# Patient Record
Sex: Male | Born: 1955
Health system: Southern US, Community
[De-identification: ages and names within clinical notes are randomized; demographics above are authoritative.]

## PROBLEM LIST (undated history)

## (undated) DIAGNOSIS — J189 Pneumonia, unspecified organism: Secondary | ICD-10-CM

## (undated) DIAGNOSIS — I10 Essential (primary) hypertension: Secondary | ICD-10-CM

## (undated) DIAGNOSIS — J349 Unspecified disorder of nose and nasal sinuses: Secondary | ICD-10-CM

## (undated) DIAGNOSIS — M1990 Unspecified osteoarthritis, unspecified site: Secondary | ICD-10-CM

## (undated) DIAGNOSIS — I2510 Atherosclerotic heart disease of native coronary artery without angina pectoris: Secondary | ICD-10-CM

## (undated) DIAGNOSIS — I219 Acute myocardial infarction, unspecified: Secondary | ICD-10-CM

## (undated) DIAGNOSIS — J449 Chronic obstructive pulmonary disease, unspecified: Secondary | ICD-10-CM

## (undated) DIAGNOSIS — R519 Headache, unspecified: Secondary | ICD-10-CM

## (undated) DIAGNOSIS — E785 Hyperlipidemia, unspecified: Secondary | ICD-10-CM

## (undated) DIAGNOSIS — D649 Anemia, unspecified: Secondary | ICD-10-CM

## (undated) DIAGNOSIS — G4730 Sleep apnea, unspecified: Secondary | ICD-10-CM

## (undated) DIAGNOSIS — K219 Gastro-esophageal reflux disease without esophagitis: Secondary | ICD-10-CM

## (undated) HISTORY — PX: CARDIAC CATHETERIZATION: SHX172

## (undated) HISTORY — PX: TONSILLECTOMY: SUR1361

## (undated) HISTORY — PX: KNEE SURGERY: SHX244

## (undated) HISTORY — PX: CORONARY ANGIOPLASTY: SHX604

## (undated) HISTORY — DX: Chronic obstructive pulmonary disease, unspecified: J44.9

## (undated) HISTORY — DX: Sleep apnea, unspecified: G47.30

## (undated) HISTORY — DX: Unspecified disorder of nose and nasal sinuses: J34.9

## (undated) HISTORY — DX: Hyperlipidemia, unspecified: E78.5

## (undated) HISTORY — DX: Essential (primary) hypertension: I10

## (undated) HISTORY — PX: NASAL SINUS SURGERY: SHX719

## (undated) MED FILL — Dexamethasone Sodium Phosphate Inj 100 MG/10ML: INTRAMUSCULAR | Qty: 1 | Status: AC

---

## 2007-10-20 ENCOUNTER — Encounter: Admission: RE | Admit: 2007-10-20 | Discharge: 2007-10-20 | Payer: Self-pay | Admitting: Occupational Medicine

## 2010-08-19 ENCOUNTER — Encounter: Payer: Self-pay | Admitting: Occupational Medicine

## 2014-01-18 ENCOUNTER — Encounter: Payer: Self-pay | Admitting: Critical Care Medicine

## 2014-01-18 ENCOUNTER — Ambulatory Visit (INDEPENDENT_AMBULATORY_CARE_PROVIDER_SITE_OTHER): Payer: Self-pay | Admitting: Critical Care Medicine

## 2014-01-18 VITALS — BP 134/80 | HR 84 | Temp 99.1°F | Ht 72.0 in | Wt 357.0 lb

## 2014-01-18 DIAGNOSIS — J441 Chronic obstructive pulmonary disease with (acute) exacerbation: Secondary | ICD-10-CM

## 2014-01-18 DIAGNOSIS — F17200 Nicotine dependence, unspecified, uncomplicated: Secondary | ICD-10-CM | POA: Insufficient documentation

## 2014-01-18 DIAGNOSIS — G4730 Sleep apnea, unspecified: Secondary | ICD-10-CM

## 2014-01-18 DIAGNOSIS — J449 Chronic obstructive pulmonary disease, unspecified: Secondary | ICD-10-CM | POA: Insufficient documentation

## 2014-01-18 DIAGNOSIS — F17210 Nicotine dependence, cigarettes, uncomplicated: Secondary | ICD-10-CM | POA: Insufficient documentation

## 2014-01-18 MED ORDER — PREDNISONE 10 MG PO TABS: ORAL_TABLET | ORAL | Status: DC

## 2014-01-18 MED ORDER — ALBUTEROL SULFATE (2.5 MG/3ML) 0.083% IN NEBU: 2.5000 mg | INHALATION_SOLUTION | Freq: Four times a day (QID) | RESPIRATORY_TRACT | Status: AC | PRN

## 2014-01-18 MED ORDER — FLUTICASONE FUROATE-VILANTEROL 100-25 MCG/INH IN AEPB: 1.0000 | INHALATION_SPRAY | Freq: Every day | RESPIRATORY_TRACT | Status: DC

## 2014-01-18 MED ORDER — AZITHROMYCIN 250 MG PO TABS: ORAL_TABLET | ORAL | Status: DC

## 2014-01-18 MED ORDER — TIOTROPIUM BROMIDE MONOHYDRATE 2.5 MCG/ACT IN AERS: INHALATION_SPRAY | RESPIRATORY_TRACT | Status: DC

## 2014-01-18 NOTE — Patient Instructions (Addendum)
Overnight sleep oximetry on cpap will be ordered Spirometry and ABGs ordered at [facility] [place] Prednisone 10mg   Take 4 for three days 3 for three days 2 for three days 1 for three days and stop Start azithromycin 250mg  Take two once then one daily until gone New cpap supplies ordered Start Breo one puff daily Start Spiriva two puff daily Stop Duoneb Stop Advair Stop atrovent Start Albuterol in nebulizer as needed Focus on smoking cessation, use nicorette Minis 4mg  6-8 per day Return 1 month

## 2014-01-18 NOTE — Progress Notes (Signed)
Subjective:    Patient ID: Antonio Benson, male    DOB: 1955/09/26, 58 y.o.   MRN: [id number]  HPI Comments: Dx Copd >19yrs.  Smokes 1PPD.  Tried: patches, meds, hypnosis, etc.    Shortness of Breath This is a chronic problem. The current episode started more than 1 year ago. The problem occurs constantly (constantly dyspneic, intermittent cough). The problem has been gradually worsening. Associated symptoms include orthopnea, PND and sputum production. Pertinent negatives include no chest pain, hemoptysis, leg pain or leg swelling. Associated symptoms comments: Felt poorly for two months  Pt has cpap and uses every night Mucus clear to brown.. Risk factors include smoking. He has tried steroid inhalers and beta agonist inhalers for the symptoms. The treatment provided moderate relief. His past medical history is significant for COPD. There is no history of allergies, asthma, bronchiolitis, CAD, DVT, a heart failure, PE or pneumonia. (Only allergy mold)    Past Medical History  Diagnosis Date  . COPD (chronic obstructive pulmonary disease)   . Sinus trouble   . Sleep apnea      No family history on file.   History   Social History  . Marital Status: Married    Spouse Name: N/A    Number of Children: N/A  . 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 Smoking status: Current Every Day Smoker -- 1.00 packs/day for 40 years    Types: Cigarettes  . Smokeless tobacco: Never Used  . Alcohol Use: No  . Drug Use: Not on file  . Sexual Activity: Not on file   Other Topics Concern  . Not on file   Social History Narrative  . No narrative on file     No Known Allergies   No outpatient prescriptions prior to visit.   No facility-administered medications prior to visit.      Review of Systems  Constitutional: Positive for unexpected weight change.  HENT: Positive for trouble swallowing.   Respiratory: Positive for  sputum production and shortness of breath. Negative for hemoptysis.   Cardiovascular: Positive for orthopnea and PND. Negative for chest pain and leg swelling.  Gastrointestinal: Negative.        Objective:   Physical Exam Filed Vitals:   01/18/14 1608  BP: 134/80  Pulse: 84  Temp: 99.1 F (37.3 C)  TempSrc: Oral  Height: 6' (1.829 m)  Weight: 357 lb (161.934 kg)  SpO2: 92%    Gen: Pleasant, well-nourished, in no distress,  normal affect  ENT: No lesions,  mouth clear,  oropharynx clear, no postnasal drip  Neck: No JVD, no TMG, no carotid bruits  Lungs: No use of accessory muscles, no dullness to percussion, exp wheezes, poor airflow  Cardiovascular: RRR, heart sounds normal, no murmur or gallops, no peripheral edema  Abdomen: soft and NT, no HSM,  BS normal  Musculoskeletal: No deformities, no cyanosis or clubbing  Neuro: alert, non focal  Skin: Warm, no lesions or rashes  No results found.  CXR: hyperinflation. NAD    Assessment & Plan:   COPD (chronic obstructive pulmonary disease) Chronic obstructive lung disease with asthmatic bronchitic component and associated morbid obesity and ongoing tobacco use Borderline desaturation with ambulation Plan Overnight sleep oximetry on cpap will be ordered Spirometry and ABGs ordered at [facility] Prednisone 10mg   Take 4 for three days 3 for three days 2 for three days 1 for three days and stop  Start azithromycin 250mg  Take two once then one daily until gone New cpap supplies ordered Start Breo one puff daily Start Spiriva two puff daily Stop Duoneb Stop Advair Stop atrovent Start Albuterol in nebulizer as needed Focus on smoking cessation, use nicorette Minis 4mg  6-8 per day Return 1 month    Tobacco use disorder Ongoing tobacco use Greater than 10 minutes smoking cessation counseling issued to the patient The patient will use Nicorette minis for smoking cessation  Sleep apnea Severe sleep apnea The  patient needs new CPAP supplies   Updated Medication List Outpatient Encounter Prescriptions as of 01/18/2014  Medication Sig  . albuterol (PROVENTIL HFA;VENTOLIN HFA) 108 (90 BASE) MCG/ACT inhaler Inhale 2 puffs into the lungs every 6 (six) hours as needed for wheezing or shortness of breath.  Marland Kitchen aspirin 81 MG tablet Take 81 mg by mouth daily.  . [DISCONTINUED] Fluticasone-Salmeterol (ADVAIR) 250-50 MCG/DOSE AEPB Inhale 1 puff into the lungs 2 (two) times daily.  . [DISCONTINUED] ipratropium (ATROVENT HFA) 17 MCG/ACT inhaler Inhale 2 puffs into the lungs 3 (three) times daily as needed for wheezing.  . [DISCONTINUED] ipratropium-albuterol (DUONEB) 0.5-2.5 (3) MG/3ML SOLN Take 3 mLs by nebulization every 6 (six) hours as needed.  Marland Kitchen albuterol (PROVENTIL) (2.5 MG/3ML) 0.083% nebulizer solution Take 3 mLs (2.5 mg total) by nebulization every 6 (six) hours as needed for wheezing or shortness of breath.  Marland Kitchen azithromycin (ZITHROMAX) 250 MG tablet Take two once then one daily until gone  . Fluticasone Furoate-Vilanterol (BREO ELLIPTA) 100-25 MCG/INH AEPB Inhale 1 puff into the lungs daily.  . predniSONE (DELTASONE) 10 MG tablet Take 4 for three days 3 for three days 2 for three days 1 for three days and stop  . Tiotropium Bromide Monohydrate (SPIRIVA RESPIMAT) 2.5 MCG/ACT AERS Two puff daily

## 2014-01-18 NOTE — Assessment & Plan Note (Signed)
Severe sleep apnea The patient needs new CPAP supplies

## 2014-01-18 NOTE — Assessment & Plan Note (Signed)
Chronic obstructive lung disease with asthmatic bronchitic component and associated morbid obesity and ongoing tobacco use Borderline desaturation with ambulation Plan Overnight sleep oximetry on cpap will be ordered Spirometry and ABGs ordered at [facility] Prednisone 10mg   Take 4 for three days 3 for three days 2 for three days 1 for three days and stop Start azithromycin 250mg  Take two once then one daily until gone New cpap supplies ordered Start Breo one puff daily Start Spiriva two puff daily Stop Duoneb Stop Advair Stop atrovent Start Albuterol in nebulizer as needed Focus on smoking cessation, use nicorette Minis 4mg  6-8 per day Return 1 month

## 2014-01-18 NOTE — Assessment & Plan Note (Signed)
Ongoing tobacco use Greater than 10 minutes smoking cessation counseling issued to the patient The patient will use Nicorette minis for smoking cessation

## 2014-01-24 ENCOUNTER — Telehealth: Payer: Self-pay | Admitting: Critical Care Medicine

## 2014-01-24 NOTE — Telephone Encounter (Signed)
ATC Amanda-no answer. WCB in the AM

## 2014-01-25 ENCOUNTER — Telehealth: Payer: Self-pay | Admitting: Critical Care Medicine

## 2014-01-25 ENCOUNTER — Encounter: Payer: Self-pay | Admitting: *Deleted

## 2014-01-25 LAB — PULMONARY FUNCTION TEST

## 2014-01-25 NOTE — Telephone Encounter (Signed)
Pt wife came into [place] office.   Pt was seen in [place] for consult on 01/18/14 by Dr. Joya Gaskins. States pt was out of work from 12/27/13 - 01/10/14 under note given by Dr. Lin Landsman. Requesting a note to keep pt out of work from June 15 - July 3 d/t medical conditions/breathing.  Reports pt's breathing is "a little better."  He is scheduled for PFTs and ABG today at 4 pm.  Per wife, pt has cut back "tremendously" on smoking. I spoke with Dr. Joya Gaskins.  He is ok with giving note for pt to be out of work from June 15 - January 28, 2014.   Note created and given to wife.  She verbalized understanding and voiced no further questions or concerns at this time.

## 2014-01-25 NOTE — Telephone Encounter (Signed)
Dr. Joya Gaskins ordered spiro pre/post and ABG to be done at [facility] to see what kind of referral is needed to fax.  Order's are in epic for PFT and spiro w/ graph. Called spoke with Rhonda-[facility]'s to see if anything specifically needs to be sent. She will take care of this and fax referral over. Nothing further needed

## 2014-02-08 ENCOUNTER — Telehealth: Payer: Self-pay | Admitting: Critical Care Medicine

## 2014-02-08 NOTE — Telephone Encounter (Signed)
Order never received by [facility] in [place]. Faxed order for ONO on CPAP and referral for [facility] in [place] to provide new cpap mask and to call me with cpap settings [phone]). Called and spoke with Ginger and she is aware that both orders has been faxed to [facility] and if she doesn't hear from them by Friday 02/11/14 to call me back, personally. Rhonda J Cobb

## 2014-02-08 NOTE — Telephone Encounter (Signed)
I called [facility] in [place] and was told they do not have any orders on pt since May of 2014. Please advise pcc's thanks

## 2014-02-08 NOTE — Telephone Encounter (Signed)
Pt orders are in epic and placed 01/18/14. I can't tell if anything has been done with them.

## 2014-02-14 ENCOUNTER — Telehealth: Payer: Self-pay | Admitting: Critical Care Medicine

## 2014-02-14 DIAGNOSIS — J418 Mixed simple and mucopurulent chronic bronchitis: Secondary | ICD-10-CM

## 2014-02-14 NOTE — Telephone Encounter (Signed)
Tell pt ABGs show no need to change beyond cpap, no indication for bipap.  CO2 not elevated  PFTs show severe obstruction. No change in therapy.needs to focus on smoking cessation

## 2014-02-14 NOTE — Telephone Encounter (Signed)
Also tell pt ono POS for desaturation. He will need OXYGEN 2L QHS only from [place] [facility]. To use With CPAP.  Needs repeat ONO on oxygen 2L with cpap.

## 2014-02-15 ENCOUNTER — Ambulatory Visit: Payer: Self-pay | Admitting: Critical Care Medicine

## 2014-02-16 NOTE — Telephone Encounter (Signed)
Order faxed to [facility] in [place]

## 2014-02-16 NOTE — Telephone Encounter (Signed)
Called, spoke with pt's wife per pt's request. Informed her of below results and recs per Dr. Joya Gaskins. She verbalized understanding of all results and recs and will inform pt. She is aware [facility] will be contacting them to set up o2 and to have ONO repeated on o2 with cpap. She is to call back if she has not heard from [place] by the end of the wk.  PCCs, orders have been placed for o2 start and ONO.  Please ensure they are sent to [place].  Thank you.

## 2014-02-25 ENCOUNTER — Encounter: Payer: Self-pay | Admitting: Critical Care Medicine

## 2014-03-01 ENCOUNTER — Encounter: Payer: Self-pay | Admitting: Critical Care Medicine

## 2014-03-01 ENCOUNTER — Ambulatory Visit (INDEPENDENT_AMBULATORY_CARE_PROVIDER_SITE_OTHER): Payer: Self-pay | Admitting: Critical Care Medicine

## 2014-03-01 VITALS — BP 132/78 | HR 69 | Temp 98.3°F | Ht 72.0 in | Wt 362.8 lb

## 2014-03-01 DIAGNOSIS — J418 Mixed simple and mucopurulent chronic bronchitis: Secondary | ICD-10-CM

## 2014-03-01 DIAGNOSIS — G4730 Sleep apnea, unspecified: Secondary | ICD-10-CM

## 2014-03-01 DIAGNOSIS — F17200 Nicotine dependence, unspecified, uncomplicated: Secondary | ICD-10-CM

## 2014-03-01 DIAGNOSIS — J411 Mucopurulent chronic bronchitis: Secondary | ICD-10-CM

## 2014-03-01 MED ORDER — METHYLPREDNISOLONE ACETATE 80 MG/ML IJ SUSP
120.0000 mg | Freq: Once | INTRAMUSCULAR | Status: AC
  Administered 2014-03-01: 120 mg via INTRAMUSCULAR

## 2014-03-01 NOTE — Assessment & Plan Note (Signed)
Ongoing tobacco use The patient was given smoking cessation counseling

## 2014-03-01 NOTE — Assessment & Plan Note (Signed)
The patient has an improperly fitting CPAP mask and will be given a new mask for improved seal and then we'll repeat overnight sleep oximetry on CPAP

## 2014-03-01 NOTE — Assessment & Plan Note (Signed)
Asthmatic bronchitis with recurrent hypoxemia at night Portion of nocturnal desaturation likely due to to improperly fitting mask Plan And Depo-Medrol injection was administered The patient will be given in a properly fitting mask Maintain inhaled medications

## 2014-03-01 NOTE — Patient Instructions (Signed)
Focus on smoking cessation, use the nicorette replacement lozenges A depomedrol 120mg  injection was given Stay on inhalers We will look into getting the oxygen and the new cpap mask obtained Return 2 months

## 2014-03-01 NOTE — Progress Notes (Signed)
Subjective:    Patient ID: Antonio Benson, male    DOB: 12/31/1955, 58 y.o.   MRN: [id number]  HPI 03/01/2014 Chief Complaint  Patient presents with  . Follow-up    sob slightly better, staying indoors due to humidlity,occass. cough-clear,occass. wheezing, denies cp or tightness, no fcs,trying ro wear CPAP does not have O2 yet and mask  Borderline desaturation with ambulation  Not yet got oxygen or new mask Pt on 1/2 PPD. Now on e cigs. The patient has not yet received a CPAP mask. The patient never received nocturnal oxygen as prescribed. Patient has a dry cough and ongoing dyspnea. Patient still smoking one half pack a day of cigarettes.             Review of Systems Constitutional:   No  weight loss, night sweats,  Fevers, chills, fatigue, lassitude. HEENT:   No headaches,  Difficulty swallowing,  Tooth/dental problems,  Sore throat,                No sneezing, itching, ear ache, nasal congestion, post nasal drip,   CV:  No chest pain,  Orthopnea, PND, swelling in lower extremities, anasarca, dizziness, palpitations  GI  No heartburn, indigestion, abdominal pain, nausea, vomiting, diarrhea, change in bowel habits, loss of appetite  Resp: Notes  shortness of breath with exertion not  at rest.  No excess mucus, no productive cough,  Notes  non-productive cough,  No coughing up of blood.  No change in color of mucus.  No wheezing.  No chest wall deformity  Skin: no rash or lesions.  GU: no dysuria, change in color of urine, no urgency or frequency.  No flank pain.  MS:  No joint pain or swelling.  No decreased range of motion.  No back pain.  Psych:  No change in mood or affect. No depression or anxiety.  No memory loss.     Objective:   Physical Exam Filed Vitals:   03/01/14 1141  BP: 132/78  Pulse: 69  Temp: 98.3 F (36.8 C)  TempSrc: Oral  Height: 6' (1.829 m)  Weight: 362 lb 12.8 oz (164.565 kg)  SpO2: 93%    Gen: Morbidly obese, in no distress,  normal  affect  ENT: No lesions,  mouth clear,  oropharynx clear, no postnasal drip  Neck: No JVD, no TMG, no carotid bruits  Lungs: No use of accessory muscles, no dullness to percussion, a few expired wheezes  Cardiovascular: RRR, heart sounds normal, no murmur or gallops, no peripheral edema  Abdomen: soft and NT, no HSM,  BS normal  Musculoskeletal: No deformities, no cyanosis or clubbing  Neuro: alert, non focal  Skin: Warm, no lesions or rashes  No results found.        Assessment & Plan:   COPD (chronic obstructive pulmonary disease) Asthmatic bronchitis with recurrent hypoxemia at night Portion of nocturnal desaturation likely due to to improperly fitting mask Plan And Depo-Medrol injection was administered The patient will be given in a properly fitting mask Maintain inhaled medications  Tobacco use disorder Ongoing tobacco use The patient was given smoking cessation counseling  Sleep apnea The patient has an improperly fitting CPAP mask and will be given a new mask for improved seal and then we'll repeat overnight sleep oximetry on CPAP   Updated Medication List Outpatient Encounter Prescriptions as of 03/01/2014  Medication Sig  . albuterol (PROVENTIL HFA;VENTOLIN HFA) 108 (90 BASE) MCG/ACT inhaler Inhale 2 puffs into the lungs every 6 (  six) hours as needed for wheezing or shortness of breath.  Marland Kitchen albuterol (PROVENTIL) (2.5 MG/3ML) 0.083% nebulizer solution Take 3 mLs (2.5 mg total) by nebulization every 6 (six) hours as needed for wheezing or shortness of breath.  Marland Kitchen aspirin 81 MG tablet Take 81 mg by mouth daily.  . Fluticasone Furoate-Vilanterol (BREO ELLIPTA) 100-25 MCG/INH AEPB Inhale 1 puff into the lungs daily.  . Tiotropium Bromide Monohydrate (SPIRIVA RESPIMAT) 2.5 MCG/ACT AERS Two puff daily  . [DISCONTINUED] azithromycin (ZITHROMAX) 250 MG tablet Take two once then one daily until gone  . [DISCONTINUED] predniSONE (DELTASONE) 10 MG tablet Take 4 for three  days 3 for three days 2 for three days 1 for three days and stop  . [EXPIRED] methylPREDNISolone acetate (DEPO-MEDROL) injection 120 mg

## 2014-04-05 ENCOUNTER — Telehealth: Payer: Self-pay | Admitting: Critical Care Medicine

## 2014-04-05 DIAGNOSIS — G4730 Sleep apnea, unspecified: Secondary | ICD-10-CM

## 2014-04-05 NOTE — Telephone Encounter (Signed)
Antonio Benson with [facility] needs order for cpap supplies for pt.  Dr. Joya Gaskins ok with this. Order placed and given to [facility].  Nothing further needed.

## 2015-01-17 DIAGNOSIS — J449 Chronic obstructive pulmonary disease, unspecified: Secondary | ICD-10-CM | POA: Insufficient documentation

## 2015-01-17 DIAGNOSIS — G4730 Sleep apnea, unspecified: Secondary | ICD-10-CM | POA: Insufficient documentation

## 2015-01-17 DIAGNOSIS — I429 Cardiomyopathy, unspecified: Secondary | ICD-10-CM | POA: Insufficient documentation

## 2016-07-25 DIAGNOSIS — J209 Acute bronchitis, unspecified: Secondary | ICD-10-CM | POA: Diagnosis not present

## 2016-07-25 DIAGNOSIS — Z1389 Encounter for screening for other disorder: Secondary | ICD-10-CM | POA: Diagnosis not present

## 2016-07-30 DIAGNOSIS — G4733 Obstructive sleep apnea (adult) (pediatric): Secondary | ICD-10-CM | POA: Diagnosis not present

## 2016-07-30 DIAGNOSIS — Z6841 Body Mass Index (BMI) 40.0 and over, adult: Secondary | ICD-10-CM | POA: Diagnosis not present

## 2016-07-30 DIAGNOSIS — F17200 Nicotine dependence, unspecified, uncomplicated: Secondary | ICD-10-CM | POA: Diagnosis not present

## 2016-07-30 DIAGNOSIS — I2510 Atherosclerotic heart disease of native coronary artery without angina pectoris: Secondary | ICD-10-CM | POA: Diagnosis not present

## 2016-07-30 DIAGNOSIS — I420 Dilated cardiomyopathy: Secondary | ICD-10-CM | POA: Diagnosis not present

## 2016-08-28 DIAGNOSIS — G4730 Sleep apnea, unspecified: Secondary | ICD-10-CM | POA: Diagnosis not present

## 2016-08-28 DIAGNOSIS — J449 Chronic obstructive pulmonary disease, unspecified: Secondary | ICD-10-CM | POA: Diagnosis not present

## 2016-08-28 DIAGNOSIS — R634 Abnormal weight loss: Secondary | ICD-10-CM | POA: Diagnosis not present

## 2017-01-30 ENCOUNTER — Encounter: Payer: Self-pay | Admitting: Cardiology

## 2017-01-30 ENCOUNTER — Ambulatory Visit (INDEPENDENT_AMBULATORY_CARE_PROVIDER_SITE_OTHER): Payer: Medicare Other | Admitting: Cardiology

## 2017-01-30 VITALS — BP 130/66 | HR 56 | Resp 12 | Ht 72.0 in | Wt 282.8 lb

## 2017-01-30 DIAGNOSIS — E785 Hyperlipidemia, unspecified: Secondary | ICD-10-CM | POA: Insufficient documentation

## 2017-01-30 DIAGNOSIS — G4733 Obstructive sleep apnea (adult) (pediatric): Secondary | ICD-10-CM

## 2017-01-30 DIAGNOSIS — J418 Mixed simple and mucopurulent chronic bronchitis: Secondary | ICD-10-CM | POA: Diagnosis not present

## 2017-01-30 DIAGNOSIS — I2510 Atherosclerotic heart disease of native coronary artery without angina pectoris: Secondary | ICD-10-CM | POA: Insufficient documentation

## 2017-01-30 DIAGNOSIS — I10 Essential (primary) hypertension: Secondary | ICD-10-CM | POA: Insufficient documentation

## 2017-01-30 NOTE — Patient Instructions (Signed)
Medication Instructions:  Your physician recommends that you continue on your current medications as directed. Please refer to the Current Medication list given to you today.   Labwork: Your physician recommends that you return for lab work in: today   Testing/Procedures: None   Follow-Up: Your physician recommends that you schedule a follow-up appointment in: 6 months   Any Other Special Instructions Will Be Listed Below (If Applicable).     If you need a refill on your cardiac medications before your next appointment, please call your pharmacy.

## 2017-01-30 NOTE — Progress Notes (Signed)
Cardiology Office Note:    Date:  01/30/2017   ID:  Antonio Benson, DOB March 29, 1956, MRN [id number]  PCP:  [name], MD  Cardiologist:  [name], MD    Referring MD: No ref. provider found   Chief Complaint  Patient presents with  . Follow-up  Coronary artery disease  History of Present Illness:    Antonio Benson is a 61 y.o. male  with coronary artery disease. He is doing very well, he is asymptomatic except for shortness of breath. He exercises in the regular basis and lost 85 pounds. I congratulated him for this. Incarcerated to keep exercising. He saidis the fact that he keeps smoking. We talked in length is 10 minutes about how he can quit potential ways to quit this habit.  Past Medical History:  Diagnosis Date  . COPD (chronic obstructive pulmonary disease) ([place])   . Hyperlipidemia   . Hypertension   . Sinus trouble   . Sleep apnea     Past Surgical History:  Procedure Laterality Date  . KNEE SURGERY    . NASAL SINUS SURGERY  1990's   deviated septum     Current Medications: Current Meds  Medication Sig  . albuterol (PROVENTIL HFA;VENTOLIN HFA) 108 (90 BASE) MCG/ACT inhaler Inhale 2 puffs into the lungs every 6 (six) hours as needed for wheezing or shortness of breath.  Marland Kitchen albuterol (PROVENTIL) (2.5 MG/3ML) 0.083% nebulizer solution Take 3 mLs (2.5 mg total) by nebulization every 6 (six) hours as needed for wheezing or shortness of breath.  Marland Kitchen aspirin 81 MG tablet Take 81 mg by mouth daily.  Marland Kitchen atorvastatin (LIPITOR) 80 MG tablet Take 1 tablet by mouth daily.  . nitroGLYCERIN (NITROSTAT) 0.4 MG SL tablet Take 1 tablet by mouth as needed for chest pain.  Marland Kitchen umeclidinium bromide (INCRUSE ELLIPTA) 62.5 MCG/INH AEPB Inhale 1 puff into the lungs daily.     Allergies:   Patient has no known allergies.   Social History   Social History  . Marital status: Married    Spouse name: N/A  . Number of children: N/A  . 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 Smoking status: Current Every Day Smoker    Packs/day: 1.00    Years: 40.00    Types: Cigarettes  . Smokeless tobacco: Never Used  . Alcohol use No  . Drug use: No  . Sexual activity: Not Asked   Other Topics Concern  . None   Social History Narrative  . None     Family History: The patient's family history includes Aneurysm in his father; Asthma in his mother. ROS:   Please see the history of present illness.     All other systems reviewed and are negative.  EKGs/Labs/Other Studies Reviewed:      Recent Labs: No results found for requested labs within last 8760 hours.  Recent Lipid Panel No results found for: CHOL, TRIG, HDL, CHOLHDL, VLDL, LDLCALC, LDLDIRECT  Physical Exam:    VS:  BP 130/66   Pulse (!) 56   Resp 12   Ht 6' (1.829 m)   Wt 282 lb 12.8 oz (128.3 kg)   BMI 38.35 kg/m     Wt Readings from Last 3 Encounters:  01/30/17 282 lb 12.8 oz (128.3 kg)  03/01/14 (!) 362 lb 12.8 oz (164.6 kg)  01/18/14 (!) 357 lb (161.9 kg)     GEN:  Well nourished, well developed in no acute distress HEENT: Normal NECK: No JVD; No carotid bruits LYMPHATICS: No lymphadenopathy CARDIAC: RRR, no murmurs, no rubs, no gallops RESPIRATORY:  Poor entry bilaterally with bilateral rhonchi. There was a few wheezes. ABDOMEN: Soft, non-tender, non-distended MUSCULOSKELETAL:  No edema; No deformity  SKIN: Warm and dry LOWER EXTREMITIES: no swelling NEUROLOGIC:  Alert and oriented x 3 PSYCHIATRIC:  Normal affect   ASSESSMENT:    1. Mixed simple and mucopurulent chronic bronchitis ([place])   2. Obstructive sleep apnea syndrome   3. Coronary artery disease involving native coronary artery of native heart without angina pectoris   4. Dyslipidemia   5. Essential hypertension    PLAN:    In order of problems listed above:  1. Coronary artery disease: Asymptomatic, doing well. We'll continue present  management. 2. Morbid obesity: He is doing great job losing weight so for 85 pounds still dropping. He cut down carbohydrates, he is exercising on a regular basis.  3. COPD: Centrally he still continued to smoke and again we spent 10 minutes talking about ways to quit again distended and he said he will try. 4. Dyslipidemia: We'll check his fasting profile today. 5. Essential hypertension. Blood pressure is well-controlled continue present management.   Medication Adjustments/Labs and Tests Ordered: Current medicines are reviewed at length with the patient today.  Concerns regarding medicines are outlined above.  No orders of the defined types were placed in this encounter.  Medication changes: No orders of the defined types were placed in this encounter.   Signed, [name], MD  01/30/2017 1:53 PM    [facility]

## 2017-01-31 LAB — LIPID PANEL
CHOLESTEROL TOTAL: 91 mg/dL — AB (ref 100–199)
Chol/HDL Ratio: 2.8 ratio (ref 0.0–5.0)
HDL: 32 mg/dL — AB (ref >39)
LDL CALC: 38 mg/dL (ref 0–99)
Triglycerides: 104 mg/dL (ref 0–149)
VLDL CHOLESTEROL CAL: 21 mg/dL (ref 5–40)

## 2017-02-19 DIAGNOSIS — G4730 Sleep apnea, unspecified: Secondary | ICD-10-CM | POA: Diagnosis not present

## 2017-02-19 DIAGNOSIS — Z87891 Personal history of nicotine dependence: Secondary | ICD-10-CM | POA: Diagnosis not present

## 2017-02-19 DIAGNOSIS — F17200 Nicotine dependence, unspecified, uncomplicated: Secondary | ICD-10-CM | POA: Diagnosis not present

## 2017-02-19 DIAGNOSIS — J449 Chronic obstructive pulmonary disease, unspecified: Secondary | ICD-10-CM | POA: Diagnosis not present

## 2017-02-19 DIAGNOSIS — R0602 Shortness of breath: Secondary | ICD-10-CM | POA: Diagnosis not present

## 2017-02-27 DIAGNOSIS — Z87891 Personal history of nicotine dependence: Secondary | ICD-10-CM | POA: Diagnosis not present

## 2017-03-06 DIAGNOSIS — Z7901 Long term (current) use of anticoagulants: Secondary | ICD-10-CM | POA: Diagnosis not present

## 2017-03-06 DIAGNOSIS — I509 Heart failure, unspecified: Secondary | ICD-10-CM | POA: Diagnosis not present

## 2017-03-06 DIAGNOSIS — G4730 Sleep apnea, unspecified: Secondary | ICD-10-CM | POA: Diagnosis not present

## 2017-03-06 DIAGNOSIS — J449 Chronic obstructive pulmonary disease, unspecified: Secondary | ICD-10-CM | POA: Diagnosis not present

## 2017-04-24 DIAGNOSIS — J441 Chronic obstructive pulmonary disease with (acute) exacerbation: Secondary | ICD-10-CM | POA: Diagnosis not present

## 2017-04-24 DIAGNOSIS — Z6839 Body mass index (BMI) 39.0-39.9, adult: Secondary | ICD-10-CM | POA: Diagnosis not present

## 2017-04-24 DIAGNOSIS — Z23 Encounter for immunization: Secondary | ICD-10-CM | POA: Diagnosis not present

## 2017-05-06 DIAGNOSIS — Z6839 Body mass index (BMI) 39.0-39.9, adult: Secondary | ICD-10-CM | POA: Diagnosis not present

## 2017-05-06 DIAGNOSIS — R5381 Other malaise: Secondary | ICD-10-CM | POA: Diagnosis not present

## 2017-05-06 DIAGNOSIS — R5383 Other fatigue: Secondary | ICD-10-CM | POA: Diagnosis not present

## 2017-05-06 DIAGNOSIS — E782 Mixed hyperlipidemia: Secondary | ICD-10-CM | POA: Diagnosis not present

## 2017-05-06 DIAGNOSIS — Z79899 Other long term (current) drug therapy: Secondary | ICD-10-CM | POA: Diagnosis not present

## 2017-05-06 DIAGNOSIS — J441 Chronic obstructive pulmonary disease with (acute) exacerbation: Secondary | ICD-10-CM | POA: Diagnosis not present

## 2017-06-27 ENCOUNTER — Encounter: Payer: Self-pay | Admitting: *Deleted

## 2017-06-27 DIAGNOSIS — I10 Essential (primary) hypertension: Secondary | ICD-10-CM | POA: Insufficient documentation

## 2017-06-27 DIAGNOSIS — J349 Unspecified disorder of nose and nasal sinuses: Secondary | ICD-10-CM | POA: Insufficient documentation

## 2017-06-27 DIAGNOSIS — E785 Hyperlipidemia, unspecified: Secondary | ICD-10-CM | POA: Insufficient documentation

## 2017-07-03 ENCOUNTER — Telehealth: Payer: Self-pay | Admitting: Cardiology

## 2017-07-03 DIAGNOSIS — J449 Chronic obstructive pulmonary disease, unspecified: Secondary | ICD-10-CM | POA: Diagnosis not present

## 2017-07-03 DIAGNOSIS — I5089 Other heart failure: Secondary | ICD-10-CM | POA: Diagnosis not present

## 2017-07-03 DIAGNOSIS — E668 Other obesity: Secondary | ICD-10-CM | POA: Diagnosis not present

## 2017-07-03 DIAGNOSIS — R0602 Shortness of breath: Secondary | ICD-10-CM | POA: Diagnosis not present

## 2017-07-03 MED ORDER — ATORVASTATIN CALCIUM 80 MG PO TABS: 80.0000 mg | ORAL_TABLET | Freq: Every day | ORAL | 6 refills | Status: DC

## 2017-07-03 NOTE — Telephone Encounter (Signed)
Refills sent

## 2017-07-03 NOTE — Telephone Encounter (Signed)
Call [facility]vastatin to [facility] on [address] in [place]

## 2017-07-30 ENCOUNTER — Ambulatory Visit (INDEPENDENT_AMBULATORY_CARE_PROVIDER_SITE_OTHER): Payer: Medicare Other | Admitting: Cardiology

## 2017-07-30 ENCOUNTER — Encounter: Payer: Self-pay | Admitting: Cardiology

## 2017-07-30 VITALS — BP 130/70 | HR 58 | Ht 72.0 in | Wt 323.1 lb

## 2017-07-30 DIAGNOSIS — I10 Essential (primary) hypertension: Secondary | ICD-10-CM

## 2017-07-30 DIAGNOSIS — I420 Dilated cardiomyopathy: Secondary | ICD-10-CM | POA: Diagnosis not present

## 2017-07-30 DIAGNOSIS — I2510 Atherosclerotic heart disease of native coronary artery without angina pectoris: Secondary | ICD-10-CM | POA: Diagnosis not present

## 2017-07-30 DIAGNOSIS — F17200 Nicotine dependence, unspecified, uncomplicated: Secondary | ICD-10-CM | POA: Diagnosis not present

## 2017-07-30 DIAGNOSIS — E785 Hyperlipidemia, unspecified: Secondary | ICD-10-CM

## 2017-07-30 NOTE — Progress Notes (Signed)
Cardiology Office Note:    Date:  07/30/2017   ID:  Antonio Benson, DOB 1955-11-03, MRN [id number]  PCP:  [name], MD  Cardiologist:  [name], MD    Referring MD: [name], MD   Chief Complaint  Patient presents with  . Follow-up  Doing well  History of Present Illness:    Antonio Benson is a 62 y.o. male with coronary artery disease as well as cardiomyopathy.  He does have chronic problem with the weight and obesity.  Last time I saw him he lost significant weight was some good diet and exercise on the regular basis but couple months ago he stopped doing it and he gained his weight back.  Does have any chest pain tightness squeezing pressure burning chest.  Obviously very frustrated with his weight going up.  He said he got a lot of emotional stress in his family but things are much better right now he is ready to go back to his routine with low carbs diet as well as exercises on the regular basis which I strongly recommended for him to do.  Past Medical History:  Diagnosis Date  . COPD (chronic obstructive pulmonary disease) ([place])   . Hyperlipidemia   . Hypertension   . Sinus trouble   . Sleep apnea     Past Surgical History:  Procedure Laterality Date  . KNEE SURGERY    . NASAL SINUS SURGERY  1990's   deviated septum     Current Medications: Current Meds  Medication Sig  . ADVAIR DISKUS 250-50 MCG/DOSE AEPB Inhale 2 puffs into the lungs daily.  Marland Kitchen albuterol (PROVENTIL HFA;VENTOLIN HFA) 108 (90 BASE) MCG/ACT inhaler Inhale 2 puffs into the lungs every 6 (six) hours as needed for wheezing or shortness of breath.  Marland Kitchen albuterol (PROVENTIL) (2.5 MG/3ML) 0.083% nebulizer solution Take 3 mLs (2.5 mg total) by nebulization every 6 (six) hours as needed for wheezing or shortness of breath.  Marland Kitchen aspirin 81 MG tablet Take 81 mg by mouth daily.  Marland Kitchen atorvastatin (LIPITOR) 80 MG tablet Take 1 tablet (80 mg total) by mouth daily.  . carvedilol (COREG)  3.125 MG tablet Take 1 tablet by mouth 2 (two) times daily.  Marland Kitchen lisinopril (PRINIVIL,ZESTRIL) 2.5 MG tablet Take 1 tablet by mouth daily.  . nitroGLYCERIN (NITROSTAT) 0.4 MG SL tablet Take 1 tablet by mouth as needed for chest pain.  Marland Kitchen umeclidinium bromide (INCRUSE ELLIPTA) 62.5 MCG/INH AEPB Inhale 1 puff into the lungs daily.     Allergies:   Patient has no known allergies.   Social History   Socioeconomic History  . Marital status: Married    Spouse name: Not on file  . Number of children: Not on file  . Years of education: Not on file  . Highest education level: Not on file  Social Needs  . Financial resource strain: Not on file  . Food insecurity - worry: Not on file  . Food insecurity - inability: Not on file  . Transportation needs - medical: Not on file  . Transportation needs - non-medical: Not on file  Occupational History  . Occupation: mail carrier  Tobacco Use  . Smoking status: Current Every Day Smoker    Packs/day: 1.00    Years: 40.00    Pack years: 40.00    Types: Cigarettes  . Smokeless tobacco: Never Used  Substance and Sexual Activity  . Alcohol use: No  . Drug use: No  . Sexual  activity: Not on file  Other Topics Concern  . Not on file  Social History Narrative  . Not on file     Family History: The patient's family history includes Aneurysm in his father; Asthma in his mother. ROS:   Please see the history of present illness.    All 14 point review of systems negative except as described per history of present illness  EKGs/Labs/Other Studies Reviewed:      Recent Labs: No results found for requested labs within last 8760 hours.  Recent Lipid Panel    Component Value Date/Time   CHOL 91 (L) 01/30/2017 1411   TRIG 104 01/30/2017 1411   HDL 32 (L) 01/30/2017 1411   CHOLHDL 2.8 01/30/2017 1411   LDLCALC 38 01/30/2017 1411    Physical Exam:    VS:  BP 130/70   Pulse (!) 58   Ht 6' (1.829 m)   Wt (!) 323 lb 1.9 oz (146.6 kg)   SpO2  94%   BMI 43.82 kg/m     Wt Readings from Last 3 Encounters:  07/30/17 (!) 323 lb 1.9 oz (146.6 kg)  01/30/17 282 lb 12.8 oz (128.3 kg)  03/01/14 (!) 362 lb 12.8 oz (164.6 kg)     GEN:  Well nourished, well developed in no acute distress HEENT: Normal NECK: No JVD; No carotid bruits LYMPHATICS: No lymphadenopathy CARDIAC: RRR, no murmurs, no rubs, no gallops RESPIRATORY:  Clear to auscultation without rales, wheezing or rhonchi  ABDOMEN: Soft, non-tender, non-distended MUSCULOSKELETAL:  No edema; No deformity  SKIN: Warm and dry LOWER EXTREMITIES: no swelling NEUROLOGIC:  Alert and oriented x 3 PSYCHIATRIC:  Normal affect   ASSESSMENT:    1. Dilated cardiomyopathy ([place])   2. Coronary artery disease involving native coronary artery of native heart without angina pectoris   3. Essential hypertension   4. Dyslipidemia   5. Morbid obesity ([place])   6. Smoking    PLAN:    In order of problems listed above:  1. Dilated cardiomyopathy: We will continue present management I want him to go back to his routine and lose weight before we evaluate him for this problem. 2. Coronary artery disease: Stable asymptomatic we will continue present management. 3. Essential hypertension: We will continue present management.  Blood pressure appears to be well controlled. 4. His lipidemia on statin high intensity which I will continue. 5. Morbid obesity: He is determined to lose weight and he can do it I believe that he will be able to do it.  I see him back in 3 months to see how he does with his weight. 6. Smoking smokes about 25 cigarettes a day we spoke in length about that and I strongly recommended him to quit he understands he will try to do it.  But overall I think we need to take 1 step by that time.  I will start with weight management and I would talk more about smoking   Medication Adjustments/Labs and Tests Ordered: Current medicines are reviewed at length with the patient today.   Concerns regarding medicines are outlined above.  No orders of the defined types were placed in this encounter.  Medication changes: No orders of the defined types were placed in this encounter.   Signed, Park Liter, MD, [facility] 07/30/2017 9:21 AM    [place]

## 2017-07-30 NOTE — Patient Instructions (Signed)
Medication Instructions:  Your physician recommends that you continue on your current medications as directed. Please refer to the Current Medication list given to you today.  Labwork: None ordered  Testing/Procedures: None ordered  Follow-Up: Your physician recommends that you schedule a follow-up appointment in: 3 months follow up with Dr. Agustin Cree   Any Other Special Instructions Will Be Listed Below (If Applicable).     If you need a refill on your cardiac medications before your next appointment, please call your pharmacy.

## 2017-08-13 ENCOUNTER — Telehealth: Payer: Self-pay | Admitting: Cardiology

## 2017-08-13 ENCOUNTER — Other Ambulatory Visit: Payer: Self-pay

## 2017-08-13 DIAGNOSIS — J449 Chronic obstructive pulmonary disease, unspecified: Secondary | ICD-10-CM | POA: Diagnosis not present

## 2017-08-13 DIAGNOSIS — R0602 Shortness of breath: Secondary | ICD-10-CM | POA: Diagnosis not present

## 2017-08-13 DIAGNOSIS — Z7951 Long term (current) use of inhaled steroids: Secondary | ICD-10-CM | POA: Diagnosis not present

## 2017-08-13 MED ORDER — LISINOPRIL 2.5 MG PO TABS: 2.5000 mg | ORAL_TABLET | Freq: Every day | ORAL | 1 refills | Status: DC

## 2017-08-13 NOTE — Telephone Encounter (Signed)
Patient is out of lisinipril. [facility] [address] They have been sending the refill request to [facility]

## 2017-08-13 NOTE — Telephone Encounter (Signed)
Med refill was sent

## 2017-09-03 DIAGNOSIS — J449 Chronic obstructive pulmonary disease, unspecified: Secondary | ICD-10-CM | POA: Diagnosis not present

## 2017-09-03 DIAGNOSIS — E668 Other obesity: Secondary | ICD-10-CM | POA: Diagnosis not present

## 2017-09-03 DIAGNOSIS — I509 Heart failure, unspecified: Secondary | ICD-10-CM | POA: Diagnosis not present

## 2017-09-03 DIAGNOSIS — R0602 Shortness of breath: Secondary | ICD-10-CM | POA: Diagnosis not present

## 2017-09-03 DIAGNOSIS — G4730 Sleep apnea, unspecified: Secondary | ICD-10-CM | POA: Diagnosis not present

## 2017-09-09 ENCOUNTER — Telehealth: Payer: Self-pay | Admitting: Cardiology

## 2017-09-09 MED ORDER — CARVEDILOL 3.125 MG PO TABS: 3.1250 mg | ORAL_TABLET | Freq: Two times a day (BID) | ORAL | 11 refills | Status: DC

## 2017-09-09 NOTE — Telephone Encounter (Signed)
[facility] [address]

## 2017-09-09 NOTE — Telephone Encounter (Signed)
[facility] [address] [facility] in [place] please.

## 2017-10-13 DIAGNOSIS — F17200 Nicotine dependence, unspecified, uncomplicated: Secondary | ICD-10-CM | POA: Diagnosis not present

## 2017-10-13 DIAGNOSIS — Z87891 Personal history of nicotine dependence: Secondary | ICD-10-CM | POA: Diagnosis not present

## 2017-10-13 DIAGNOSIS — I5089 Other heart failure: Secondary | ICD-10-CM | POA: Diagnosis not present

## 2017-10-13 DIAGNOSIS — J449 Chronic obstructive pulmonary disease, unspecified: Secondary | ICD-10-CM | POA: Diagnosis not present

## 2017-10-13 DIAGNOSIS — G4739 Other sleep apnea: Secondary | ICD-10-CM | POA: Diagnosis not present

## 2017-10-13 DIAGNOSIS — R0602 Shortness of breath: Secondary | ICD-10-CM | POA: Diagnosis not present

## 2017-10-15 DIAGNOSIS — J449 Chronic obstructive pulmonary disease, unspecified: Secondary | ICD-10-CM | POA: Diagnosis not present

## 2017-10-22 DIAGNOSIS — Z87891 Personal history of nicotine dependence: Secondary | ICD-10-CM | POA: Diagnosis not present

## 2017-10-22 DIAGNOSIS — J449 Chronic obstructive pulmonary disease, unspecified: Secondary | ICD-10-CM | POA: Diagnosis not present

## 2017-10-22 DIAGNOSIS — G4710 Hypersomnia, unspecified: Secondary | ICD-10-CM | POA: Diagnosis not present

## 2017-10-22 DIAGNOSIS — I5089 Other heart failure: Secondary | ICD-10-CM | POA: Diagnosis not present

## 2017-10-22 DIAGNOSIS — F17210 Nicotine dependence, cigarettes, uncomplicated: Secondary | ICD-10-CM | POA: Diagnosis not present

## 2017-10-30 ENCOUNTER — Ambulatory Visit (INDEPENDENT_AMBULATORY_CARE_PROVIDER_SITE_OTHER): Payer: Medicare Other | Admitting: Cardiology

## 2017-10-30 ENCOUNTER — Encounter: Payer: Self-pay | Admitting: Cardiology

## 2017-10-30 VITALS — BP 126/70 | HR 68 | Ht 72.0 in | Wt 324.0 lb

## 2017-10-30 DIAGNOSIS — J418 Mixed simple and mucopurulent chronic bronchitis: Secondary | ICD-10-CM

## 2017-10-30 DIAGNOSIS — I2510 Atherosclerotic heart disease of native coronary artery without angina pectoris: Secondary | ICD-10-CM | POA: Diagnosis not present

## 2017-10-30 DIAGNOSIS — I420 Dilated cardiomyopathy: Secondary | ICD-10-CM

## 2017-10-30 DIAGNOSIS — F17200 Nicotine dependence, unspecified, uncomplicated: Secondary | ICD-10-CM | POA: Diagnosis not present

## 2017-10-30 DIAGNOSIS — E785 Hyperlipidemia, unspecified: Secondary | ICD-10-CM

## 2017-10-30 DIAGNOSIS — I10 Essential (primary) hypertension: Secondary | ICD-10-CM | POA: Diagnosis not present

## 2017-10-30 NOTE — Progress Notes (Signed)
Cardiology Office Note:    Date:  10/30/2017   ID:  Antonio Benson, DOB [birth year], MRN [id number]  PCP:  [name], MD  Cardiologist:  [name], MD    Referring MD: [name], MD   Chief Complaint  Patient presents with  . Follow-up  Doing well  History of Present Illness:    Antonio Benson is a 62 y.o. male with history of cardio myopathy: Coronary artery disease, hypertension, morbid obesity.  Seems to be doing well.  Admitted that he gave up on diet and exercises but getting committed to go back to it.  Denies have any shortness of breath chest pain tightness squeezing pressure burning chest.  Past Medical History:  Diagnosis Date  . COPD (chronic obstructive pulmonary disease) ([place])   . Hyperlipidemia   . Hypertension   . Sinus trouble   . Sleep apnea     Past Surgical History:  Procedure Laterality Date  . KNEE SURGERY    . NASAL SINUS SURGERY  1990's   deviated septum     Current Medications: Current Meds  Medication Sig  . ADVAIR DISKUS 250-50 MCG/DOSE AEPB Inhale 2 puffs into the lungs daily.  Marland Kitchen albuterol (PROVENTIL HFA;VENTOLIN HFA) 108 (90 BASE) MCG/ACT inhaler Inhale 2 puffs into the lungs every 6 (six) hours as needed for wheezing or shortness of breath.  Marland Kitchen albuterol (PROVENTIL) (2.5 MG/3ML) 0.083% nebulizer solution Take 3 mLs (2.5 mg total) by nebulization every 6 (six) hours as needed for wheezing or shortness of breath.  Marland Kitchen aspirin 81 MG tablet Take 81 mg by mouth daily.  Marland Kitchen atorvastatin (LIPITOR) 80 MG tablet Take 1 tablet (80 mg total) by mouth daily.  . carvedilol (COREG) 3.125 MG tablet Take 1 tablet (3.125 mg total) by mouth 2 (two) times daily.  Marland Kitchen lisinopril (PRINIVIL,ZESTRIL) 2.5 MG tablet Take 1 tablet (2.5 mg total) by mouth daily.  . nitroGLYCERIN (NITROSTAT) 0.4 MG SL tablet Take 1 tablet by mouth as needed for chest pain.  Marland Kitchen umeclidinium bromide (INCRUSE ELLIPTA) 62.5 MCG/INH AEPB Inhale 1 puff into the lungs  daily.     Allergies:   Patient has no known allergies.   Social History   Socioeconomic History  . Marital status: Married    Spouse name: Not on file  . Number of children: Not on file  . Years of education: Not on file  . Highest education level: Not on file  Occupational History  . Occupation: mail carrier  Social Needs  . Financial resource strain: Not on file  . Food insecurity:    Worry: Not on file    Inability: Not on file  . Transportation needs:    Medical: Not on file    Non-medical: Not on file  Tobacco Use  . Smoking status: Current Every Day Smoker    Packs/day: 1.00    Years: 40.00    Pack years: 40.00    Types: Cigarettes  . Smokeless tobacco: Never Used  Substance and Sexual Activity  . Alcohol use: No  . Drug use: No  . Sexual activity: Not on file  Lifestyle  . Physical activity:    Days per week: Not on file    Minutes per session: Not on file  . Stress: Not on file  Relationships  . Social connections:    Talks on phone: Not on file    Gets together: Not on file    Attends religious service: Not on file  Active member of club or organization: Not on file    Attends meetings of clubs or organizations: Not on file    Relationship status: Not on file  Other Topics Concern  . Not on file  Social History Narrative  . Not on file     Family History: The patient's family history includes Aneurysm in his father; Asthma in his mother. ROS:   Please see the history of present illness.    All 14 point review of systems negative except as described per history of present illness  EKGs/Labs/Other Studies Reviewed:      Recent Labs: No results found for requested labs within last 8760 hours.  Recent Lipid Panel    Component Value Date/Time   CHOL 91 (L) 01/30/2017 1411   TRIG 104 01/30/2017 1411   HDL 32 (L) 01/30/2017 1411   CHOLHDL 2.8 01/30/2017 1411   LDLCALC 38 01/30/2017 1411    Physical Exam:    VS:  BP 126/70   Pulse 68    Ht 6' (1.829 m)   Wt (!) 324 lb (147 kg)   SpO2 95%   BMI 43.94 kg/m     Wt Readings from Last 3 Encounters:  10/30/17 (!) 324 lb (147 kg)  07/30/17 (!) 323 lb 1.9 oz (146.6 kg)  01/30/17 282 lb 12.8 oz (128.3 kg)     GEN:  Well nourished, well developed in no acute distress HEENT: Normal NECK: No JVD; No carotid bruits LYMPHATICS: No lymphadenopathy CARDIAC: RRR, no murmurs, no rubs, no gallops RESPIRATORY:  Clear to auscultation without rales, wheezing or rhonchi  ABDOMEN: Soft, non-tender, non-distended MUSCULOSKELETAL:  No edema; No deformity  SKIN: Warm and dry LOWER EXTREMITIES: no swelling NEUROLOGIC:  Alert and oriented x 3 PSYCHIATRIC:  Normal affect   ASSESSMENT:    1. Dilated cardiomyopathy ([place])   2. Coronary artery disease involving native coronary artery of native heart without angina pectoris   3. Essential hypertension   4. Mixed simple and mucopurulent chronic bronchitis ([place])   5. Dyslipidemia   6. Smoking    PLAN:    In order of problems listed above:  1. Dilated cardiomyopathy: Will do echocardiogram on him after we come back to aspirin which will be in 3 months.  We talked about diet and good exercise habits he will go back to it. 2. Artery disease: Denies having a chest pain tightness squeezing pressure burning chest. 3. Essential hypertension: Blood pressure well controlled. 4. Dyslipidemia: We will ask him to have fasting lipid profile done today.  He is fasting 5. Walking again he spent some time talking about the need to quit he understands and will try to work on it   Medication Adjustments/Labs and Tests Ordered: Current medicines are reviewed at length with the patient today.  Concerns regarding medicines are outlined above.  No orders of the defined types were placed in this encounter.  Medication changes: No orders of the defined types were placed in this encounter.   Signed, Park Liter, MD, [facility] 10/30/2017 10:37 AM      [place]

## 2017-10-30 NOTE — Addendum Note (Signed)
Addended by: Austin Miles on: 10/30/2017 10:45 AM   Modules accepted: Orders

## 2017-10-30 NOTE — Patient Instructions (Signed)
Medication Instructions:  Your physician recommends that you continue on your current medications as directed. Please refer to the Current Medication list given to you today.   Labwork: Your physician recommends that you return for lab work today: lipid panel.  Testing/Procedures: Your physician has requested that you have an echocardiogram. Echocardiography is a painless test that uses sound waves to create images of your heart. It provides your doctor with information about the size and shape of your heart and how well your heart's chambers and valves are working. This procedure takes approximately one hour. There are no restrictions for this procedure. This test will be scheduled in the new [place] office in [place].   Follow-Up: Your physician wants you to follow-up in: 5 months. You will receive a reminder letter in the mail two months in advance. If you don't receive a letter, please call our office to schedule the follow-up appointment.   Any Other Special Instructions Will Be Listed Below (If Applicable).     If you need a refill on your cardiac medications before your next appointment, please call your pharmacy.

## 2017-10-31 LAB — LIPID PANEL
CHOLESTEROL TOTAL: 111 mg/dL (ref 100–199)
Chol/HDL Ratio: 4.3 ratio (ref 0.0–5.0)
HDL: 26 mg/dL — ABNORMAL LOW (ref >39)
LDL Calculated: 54 mg/dL (ref 0–99)
Triglycerides: 156 mg/dL — ABNORMAL HIGH (ref 0–149)
VLDL Cholesterol Cal: 31 mg/dL (ref 5–40)

## 2017-11-12 DIAGNOSIS — G4710 Hypersomnia, unspecified: Secondary | ICD-10-CM | POA: Diagnosis not present

## 2017-11-12 DIAGNOSIS — G4730 Sleep apnea, unspecified: Secondary | ICD-10-CM | POA: Diagnosis not present

## 2017-12-02 DIAGNOSIS — F17200 Nicotine dependence, unspecified, uncomplicated: Secondary | ICD-10-CM | POA: Diagnosis not present

## 2017-12-02 DIAGNOSIS — E669 Obesity, unspecified: Secondary | ICD-10-CM | POA: Diagnosis not present

## 2017-12-02 DIAGNOSIS — Z87891 Personal history of nicotine dependence: Secondary | ICD-10-CM | POA: Diagnosis not present

## 2017-12-02 DIAGNOSIS — J449 Chronic obstructive pulmonary disease, unspecified: Secondary | ICD-10-CM | POA: Diagnosis not present

## 2017-12-02 DIAGNOSIS — G4739 Other sleep apnea: Secondary | ICD-10-CM | POA: Diagnosis not present

## 2017-12-02 DIAGNOSIS — I5089 Other heart failure: Secondary | ICD-10-CM | POA: Diagnosis not present

## 2017-12-16 DIAGNOSIS — J329 Chronic sinusitis, unspecified: Secondary | ICD-10-CM | POA: Diagnosis not present

## 2017-12-16 DIAGNOSIS — Z0000 Encounter for general adult medical examination without abnormal findings: Secondary | ICD-10-CM | POA: Diagnosis not present

## 2017-12-16 DIAGNOSIS — J40 Bronchitis, not specified as acute or chronic: Secondary | ICD-10-CM | POA: Diagnosis not present

## 2018-02-04 ENCOUNTER — Ambulatory Visit (INDEPENDENT_AMBULATORY_CARE_PROVIDER_SITE_OTHER): Payer: Medicare Other

## 2018-02-04 ENCOUNTER — Other Ambulatory Visit: Payer: Self-pay

## 2018-02-04 DIAGNOSIS — F17200 Nicotine dependence, unspecified, uncomplicated: Secondary | ICD-10-CM

## 2018-02-04 DIAGNOSIS — J418 Mixed simple and mucopurulent chronic bronchitis: Secondary | ICD-10-CM

## 2018-02-04 DIAGNOSIS — I10 Essential (primary) hypertension: Secondary | ICD-10-CM

## 2018-02-04 DIAGNOSIS — I420 Dilated cardiomyopathy: Secondary | ICD-10-CM | POA: Diagnosis not present

## 2018-02-04 DIAGNOSIS — E785 Hyperlipidemia, unspecified: Secondary | ICD-10-CM | POA: Diagnosis not present

## 2018-02-04 DIAGNOSIS — I2510 Atherosclerotic heart disease of native coronary artery without angina pectoris: Secondary | ICD-10-CM

## 2018-02-04 MED ORDER — PERFLUTREN LIPID MICROSPHERE: 6.0000 mL | INTRAVENOUS | Status: AC | PRN

## 2018-02-04 MED ORDER — PERFLUTREN LIPID MICROSPHERE: 1.0000 mL | INTRAVENOUS | Status: DC | PRN

## 2018-02-04 NOTE — Progress Notes (Signed)
Echocardiogram with contrast was performed.  [place]

## 2018-02-13 ENCOUNTER — Telehealth: Payer: Self-pay | Admitting: Cardiology

## 2018-02-13 ENCOUNTER — Other Ambulatory Visit: Payer: Self-pay

## 2018-02-13 MED ORDER — LISINOPRIL 2.5 MG PO TABS: 2.5000 mg | ORAL_TABLET | Freq: Every day | ORAL | 2 refills | Status: DC

## 2018-02-13 NOTE — Telephone Encounter (Signed)
°*  STAT* If patient is at the pharmacy, call can be transferred to refill team.   1. Which medications need to be refilled? (please list name of each medication and dose if known) Lisinopril  Takes once daily   2. Which pharmacy/location (including street and city if local pharmacy) is medication to be sent to? [facility]  3. Do they need a 30 day or 90 day supply? [place]

## 2018-02-13 NOTE — Telephone Encounter (Signed)
Refill has been sent.  °

## 2018-03-02 DIAGNOSIS — F17200 Nicotine dependence, unspecified, uncomplicated: Secondary | ICD-10-CM | POA: Diagnosis not present

## 2018-03-02 DIAGNOSIS — J189 Pneumonia, unspecified organism: Secondary | ICD-10-CM | POA: Diagnosis not present

## 2018-03-02 DIAGNOSIS — Z6841 Body Mass Index (BMI) 40.0 and over, adult: Secondary | ICD-10-CM | POA: Diagnosis not present

## 2018-03-02 DIAGNOSIS — J441 Chronic obstructive pulmonary disease with (acute) exacerbation: Secondary | ICD-10-CM | POA: Diagnosis not present

## 2018-03-19 ENCOUNTER — Telehealth: Payer: Self-pay | Admitting: Cardiology

## 2018-03-19 MED ORDER — NITROGLYCERIN 0.4 MG SL SUBL: 0.4000 mg | SUBLINGUAL_TABLET | SUBLINGUAL | 6 refills | Status: DC | PRN

## 2018-03-19 MED ORDER — ATORVASTATIN CALCIUM 80 MG PO TABS: 80.0000 mg | ORAL_TABLET | Freq: Every day | ORAL | 6 refills | Status: DC

## 2018-03-19 NOTE — Telephone Encounter (Signed)
[facility]ills [facility]in [facility] [place].

## 2018-03-19 NOTE — Telephone Encounter (Signed)
[facility] [address]

## 2018-04-01 DIAGNOSIS — J159 Unspecified bacterial pneumonia: Secondary | ICD-10-CM | POA: Diagnosis not present

## 2018-04-01 DIAGNOSIS — R918 Other nonspecific abnormal finding of lung field: Secondary | ICD-10-CM | POA: Diagnosis not present

## 2018-04-01 DIAGNOSIS — R0602 Shortness of breath: Secondary | ICD-10-CM | POA: Diagnosis not present

## 2018-04-01 DIAGNOSIS — E668 Other obesity: Secondary | ICD-10-CM | POA: Diagnosis not present

## 2018-04-01 DIAGNOSIS — I5089 Other heart failure: Secondary | ICD-10-CM | POA: Diagnosis not present

## 2018-04-07 DIAGNOSIS — R918 Other nonspecific abnormal finding of lung field: Secondary | ICD-10-CM | POA: Diagnosis not present

## 2018-04-20 DIAGNOSIS — R918 Other nonspecific abnormal finding of lung field: Secondary | ICD-10-CM | POA: Diagnosis not present

## 2018-04-20 DIAGNOSIS — J449 Chronic obstructive pulmonary disease, unspecified: Secondary | ICD-10-CM | POA: Diagnosis not present

## 2018-04-20 DIAGNOSIS — Z6841 Body Mass Index (BMI) 40.0 and over, adult: Secondary | ICD-10-CM | POA: Diagnosis not present

## 2018-04-24 DIAGNOSIS — R911 Solitary pulmonary nodule: Secondary | ICD-10-CM | POA: Diagnosis not present

## 2018-04-24 DIAGNOSIS — I700 Atherosclerosis of aorta: Secondary | ICD-10-CM | POA: Diagnosis not present

## 2018-04-24 DIAGNOSIS — I2510 Atherosclerotic heart disease of native coronary artery without angina pectoris: Secondary | ICD-10-CM | POA: Diagnosis not present

## 2018-04-28 DIAGNOSIS — J449 Chronic obstructive pulmonary disease, unspecified: Secondary | ICD-10-CM | POA: Diagnosis not present

## 2018-04-28 DIAGNOSIS — R918 Other nonspecific abnormal finding of lung field: Secondary | ICD-10-CM | POA: Diagnosis not present

## 2018-04-28 DIAGNOSIS — E668 Other obesity: Secondary | ICD-10-CM | POA: Diagnosis not present

## 2018-07-06 DIAGNOSIS — H6123 Impacted cerumen, bilateral: Secondary | ICD-10-CM | POA: Diagnosis not present

## 2018-07-06 DIAGNOSIS — Z6841 Body Mass Index (BMI) 40.0 and over, adult: Secondary | ICD-10-CM | POA: Diagnosis not present

## 2018-07-06 DIAGNOSIS — Z23 Encounter for immunization: Secondary | ICD-10-CM | POA: Diagnosis not present

## 2018-07-06 DIAGNOSIS — J329 Chronic sinusitis, unspecified: Secondary | ICD-10-CM | POA: Diagnosis not present

## 2018-07-20 ENCOUNTER — Ambulatory Visit (INDEPENDENT_AMBULATORY_CARE_PROVIDER_SITE_OTHER): Payer: Medicare Other | Admitting: Cardiology

## 2018-07-20 ENCOUNTER — Encounter: Payer: Self-pay | Admitting: Cardiology

## 2018-07-20 VITALS — BP 134/64 | HR 80 | Wt 319.2 lb

## 2018-07-20 DIAGNOSIS — I2510 Atherosclerotic heart disease of native coronary artery without angina pectoris: Secondary | ICD-10-CM

## 2018-07-20 DIAGNOSIS — I10 Essential (primary) hypertension: Secondary | ICD-10-CM | POA: Diagnosis not present

## 2018-07-20 DIAGNOSIS — F17200 Nicotine dependence, unspecified, uncomplicated: Secondary | ICD-10-CM

## 2018-07-20 DIAGNOSIS — IMO0001 Reserved for inherently not codable concepts without codable children: Secondary | ICD-10-CM

## 2018-07-20 LAB — HEPATIC FUNCTION PANEL
ALBUMIN: 4.2 g/dL (ref 3.6–4.8)
ALT: 11 IU/L (ref 0–44)
AST: 16 IU/L (ref 0–40)
Alkaline Phosphatase: 116 IU/L (ref 39–117)
BILIRUBIN TOTAL: 0.8 mg/dL (ref 0.0–1.2)
BILIRUBIN, DIRECT: 0.24 mg/dL (ref 0.00–0.40)
Total Protein: 6 g/dL (ref 6.0–8.5)

## 2018-07-20 LAB — LIPID PANEL
CHOL/HDL RATIO: 3.7 ratio (ref 0.0–5.0)
Cholesterol, Total: 88 mg/dL — ABNORMAL LOW (ref 100–199)
HDL: 24 mg/dL — AB (ref >39)
LDL Calculated: 38 mg/dL (ref 0–99)
Triglycerides: 131 mg/dL (ref 0–149)
VLDL CHOLESTEROL CAL: 26 mg/dL (ref 5–40)

## 2018-07-20 NOTE — Progress Notes (Signed)
Cardiology Office Note:    Date:  07/20/2018   ID:  Antonio Benson, DOB May 13, 1956, MRN [id number]  PCP:  [name], MD  Cardiologist:  [name], MD    Referring MD: [name], MD   Chief Complaint  Patient presents with  . Follow-up  Doing well  History of Present Illness:    Antonio Benson is a 62 y.o. male with coronary artery disease, history of cardiomyopathy however latest echocardiogram showed preserved left ventricular ejection fraction.  Overall he is very sedentary he admits that he does not exercise on the regular basis.  He still continue to smoke.  Denies have any chest pain tightness squeezing pressure burning chest no shortness of breath no swelling of lower extremities.  Past Medical History:  Diagnosis Date  . COPD (chronic obstructive pulmonary disease) ([place])   . Hyperlipidemia   . Hypertension   . Sinus trouble   . Sleep apnea     Past Surgical History:  Procedure Laterality Date  . KNEE SURGERY    . NASAL SINUS SURGERY  1990's   deviated septum     Current Medications: Current Meds  Medication Sig  . ADVAIR DISKUS 250-50 MCG/DOSE AEPB Inhale 2 puffs into the lungs daily.  Marland Kitchen albuterol (PROVENTIL HFA;VENTOLIN HFA) 108 (90 BASE) MCG/ACT inhaler Inhale 2 puffs into the lungs every 6 (six) hours as needed for wheezing or shortness of breath.  Marland Kitchen albuterol (PROVENTIL) (2.5 MG/3ML) 0.083% nebulizer solution Take 3 mLs (2.5 mg total) by nebulization every 6 (six) hours as needed for wheezing or shortness of breath.  Marland Kitchen aspirin 81 MG tablet Take 81 mg by mouth daily.  Marland Kitchen atorvastatin (LIPITOR) 80 MG tablet Take 1 tablet (80 mg total) by mouth daily.  . carvedilol (COREG) 3.125 MG tablet Take 1 tablet (3.125 mg total) by mouth 2 (two) times daily.  Marland Kitchen lisinopril (PRINIVIL,ZESTRIL) 2.5 MG tablet Take 1 tablet (2.5 mg total) by mouth daily.  . nitroGLYCERIN (NITROSTAT) 0.4 MG SL tablet Place 1 tablet (0.4 mg total) under the tongue  every 5 (five) minutes as needed for chest pain.  Marland Kitchen umeclidinium bromide (INCRUSE ELLIPTA) 62.5 MCG/INH AEPB Inhale 1 puff into the lungs daily.     Allergies:   Patient has no known allergies.   Social History   Socioeconomic History  . Marital status: Married    Spouse name: Not on file  . Number of children: Not on file  . Years of education: Not on file  . Highest education level: Not on file  Occupational History  . Occupation: mail carrier  Social Needs  . Financial resource strain: Not on file  . Food insecurity:    Worry: Not on file    Inability: Not on file  . Transportation needs:    Medical: Not on file    Non-medical: Not on file  Tobacco Use  . Smoking status: Current Every Day Smoker    Packs/day: 1.00    Years: 40.00    Pack years: 40.00    Types: Cigarettes  . Smokeless tobacco: Never Used  Substance and Sexual Activity  . Alcohol use: No  . Drug use: No  . Sexual activity: Not on file  Lifestyle  . Physical activity:    Days per week: Not on file    Minutes per session: Not on file  . Stress: Not on file  Relationships  . Social connections:    Talks on phone: Not on file  Gets together: Not on file    Attends religious service: Not on file    Active member of club or organization: Not on file    Attends meetings of clubs or organizations: Not on file    Relationship status: Not on file  Other Topics Concern  . Not on file  Social History Narrative  . Not on file     Family History: The patient's family history includes Aneurysm in his father; Asthma in his mother. ROS:   Please see the history of present illness.    All 14 point review of systems negative except as described per history of present illness  EKGs/Labs/Other Studies Reviewed:      Recent Labs: No results found for requested labs within last 8760 hours.  Recent Lipid Panel    Component Value Date/Time   CHOL 111 10/30/2017 1055   TRIG 156 (H) 10/30/2017 1055    HDL 26 (L) 10/30/2017 1055   CHOLHDL 4.3 10/30/2017 1055   LDLCALC 54 10/30/2017 1055    Physical Exam:    VS:  BP 134/64   Pulse 80   Wt (!) 319 lb 3.2 oz (144.8 kg)   SpO2 (!) 12%   BMI 43.29 kg/m     Wt Readings from Last 3 Encounters:  07/20/18 (!) 319 lb 3.2 oz (144.8 kg)  10/30/17 (!) 324 lb (147 kg)  07/30/17 (!) 323 lb 1.9 oz (146.6 kg)     GEN:  Well nourished, well developed in no acute distress HEENT: Normal NECK: No JVD; No carotid bruits LYMPHATICS: No lymphadenopathy CARDIAC: RRR, no murmurs, no rubs, no gallops RESPIRATORY:  Clear to auscultation without rales, wheezing or rhonchi  ABDOMEN: Soft, non-tender, non-distended MUSCULOSKELETAL:  No edema; No deformity  SKIN: Warm and dry LOWER EXTREMITIES: no swelling NEUROLOGIC:  Alert and oriented x 3 PSYCHIATRIC:  Normal affect   ASSESSMENT:    1. Coronary artery disease involving native coronary artery of native heart without angina pectoris   2. Essential hypertension   3. Smoking   4. Morbid obesity ([place])    PLAN:    In order of problems listed above:  1. Coronary disease stable on appropriate medications which I will continue. 2. Essential hypertension blood pressure well controlled continue present management. 3. Smoking we had a long discussion about need to quit he understand he will try to do that. 4. Morbid obesity: He promised me to start exercising on a regular basis. 5. Dyslipidemia we will ask him to have fasting lipid profile done.   Medication Adjustments/Labs and Tests Ordered: Current medicines are reviewed at length with the patient today.  Concerns regarding medicines are outlined above.  No orders of the defined types were placed in this encounter.  Medication changes: No orders of the defined types were placed in this encounter.   Signed, Park Liter, MD, [facility] 07/20/2018 10:23 AM    [place]

## 2018-07-20 NOTE — Patient Instructions (Signed)
Medication Instructions:  Your physician recommends that you continue on your current medications as directed. Please refer to the Current Medication list given to you today.  If you need a refill on your cardiac medications before your next appointment, please call your pharmacy.   Lab work: Your physician recommends that you return for lab work today: Lft, and Lipids  If you have labs (blood work) drawn today and your tests are completely normal, you will receive your results only by: Marland Kitchen MyChart Message (if you have MyChart) OR . A paper copy in the mail If you have any lab test that is abnormal or we need to change your treatment, we will call you to review the results.  Testing/Procedures: None.    Follow-Up: At [facility], you and your health needs are our priority.  As part of our continuing mission to provide you with exceptional heart care, we have created designated Provider Care Teams.  These Care Teams include your primary Cardiologist (physician) and Advanced Practice Providers (APPs -  Physician Assistants and Nurse Practitioners) who all work together to provide you with the care you need, when you need it. You will need a follow up appointment in 5 months.  Please call our office 2 months in advance to schedule this appointment.  You may see No primary care provider on file. or another member of our [employer] Provider Team in [place]: Shirlee More, MD . Jyl Heinz, MD  Any Other Special Instructions Will Be Listed Below (If Applicable).

## 2018-07-20 NOTE — Addendum Note (Signed)
Addended by: Ashok Norris on: 07/20/2018 10:31 AM   Modules accepted: Orders

## 2018-08-08 ENCOUNTER — Other Ambulatory Visit: Payer: Self-pay | Admitting: Cardiology

## 2018-08-19 ENCOUNTER — Other Ambulatory Visit: Payer: Self-pay | Admitting: Cardiology

## 2018-09-01 DIAGNOSIS — G4739 Other sleep apnea: Secondary | ICD-10-CM | POA: Diagnosis not present

## 2018-09-01 DIAGNOSIS — R918 Other nonspecific abnormal finding of lung field: Secondary | ICD-10-CM | POA: Diagnosis not present

## 2018-09-01 DIAGNOSIS — E668 Other obesity: Secondary | ICD-10-CM | POA: Diagnosis not present

## 2018-09-01 DIAGNOSIS — J449 Chronic obstructive pulmonary disease, unspecified: Secondary | ICD-10-CM | POA: Diagnosis not present

## 2018-10-07 DIAGNOSIS — J329 Chronic sinusitis, unspecified: Secondary | ICD-10-CM | POA: Diagnosis not present

## 2018-10-07 DIAGNOSIS — Z6841 Body Mass Index (BMI) 40.0 and over, adult: Secondary | ICD-10-CM | POA: Diagnosis not present

## 2018-11-06 ENCOUNTER — Other Ambulatory Visit: Payer: Self-pay

## 2018-11-06 MED ORDER — LISINOPRIL 2.5 MG PO TABS: 2.5000 mg | ORAL_TABLET | Freq: Every day | ORAL | 2 refills | Status: DC

## 2018-12-11 ENCOUNTER — Other Ambulatory Visit: Payer: Self-pay | Admitting: Cardiology

## 2018-12-30 DIAGNOSIS — J449 Chronic obstructive pulmonary disease, unspecified: Secondary | ICD-10-CM | POA: Diagnosis not present

## 2018-12-30 DIAGNOSIS — J320 Chronic maxillary sinusitis: Secondary | ICD-10-CM | POA: Diagnosis not present

## 2019-01-04 DIAGNOSIS — F17200 Nicotine dependence, unspecified, uncomplicated: Secondary | ICD-10-CM | POA: Diagnosis not present

## 2019-01-04 DIAGNOSIS — J029 Acute pharyngitis, unspecified: Secondary | ICD-10-CM | POA: Diagnosis not present

## 2019-01-05 DIAGNOSIS — J029 Acute pharyngitis, unspecified: Secondary | ICD-10-CM | POA: Diagnosis not present

## 2019-01-05 DIAGNOSIS — H903 Sensorineural hearing loss, bilateral: Secondary | ICD-10-CM | POA: Diagnosis not present

## 2019-01-05 DIAGNOSIS — H66002 Acute suppurative otitis media without spontaneous rupture of ear drum, left ear: Secondary | ICD-10-CM | POA: Diagnosis not present

## 2019-01-05 DIAGNOSIS — F17210 Nicotine dependence, cigarettes, uncomplicated: Secondary | ICD-10-CM | POA: Diagnosis not present

## 2019-03-06 ENCOUNTER — Other Ambulatory Visit: Payer: Self-pay | Admitting: Cardiology

## 2019-03-09 NOTE — Telephone Encounter (Signed)
Carvedilol refill sent, requires f/u appt

## 2019-03-12 ENCOUNTER — Other Ambulatory Visit: Payer: Self-pay | Admitting: Cardiology

## 2019-03-22 DIAGNOSIS — I509 Heart failure, unspecified: Secondary | ICD-10-CM | POA: Diagnosis not present

## 2019-03-22 DIAGNOSIS — Z79899 Other long term (current) drug therapy: Secondary | ICD-10-CM | POA: Diagnosis not present

## 2019-03-22 DIAGNOSIS — Z0000 Encounter for general adult medical examination without abnormal findings: Secondary | ICD-10-CM | POA: Diagnosis not present

## 2019-03-22 DIAGNOSIS — Z6836 Body mass index (BMI) 36.0-36.9, adult: Secondary | ICD-10-CM | POA: Diagnosis not present

## 2019-03-22 DIAGNOSIS — R5383 Other fatigue: Secondary | ICD-10-CM | POA: Diagnosis not present

## 2019-03-22 DIAGNOSIS — F17200 Nicotine dependence, unspecified, uncomplicated: Secondary | ICD-10-CM | POA: Diagnosis not present

## 2019-03-22 DIAGNOSIS — E782 Mixed hyperlipidemia: Secondary | ICD-10-CM | POA: Diagnosis not present

## 2019-03-22 DIAGNOSIS — I951 Orthostatic hypotension: Secondary | ICD-10-CM | POA: Diagnosis not present

## 2019-03-25 DIAGNOSIS — F17210 Nicotine dependence, cigarettes, uncomplicated: Secondary | ICD-10-CM | POA: Diagnosis not present

## 2019-03-25 DIAGNOSIS — H903 Sensorineural hearing loss, bilateral: Secondary | ICD-10-CM | POA: Diagnosis not present

## 2019-03-25 DIAGNOSIS — R42 Dizziness and giddiness: Secondary | ICD-10-CM | POA: Insufficient documentation

## 2019-03-25 DIAGNOSIS — H6122 Impacted cerumen, left ear: Secondary | ICD-10-CM | POA: Insufficient documentation

## 2019-03-25 DIAGNOSIS — H938X2 Other specified disorders of left ear: Secondary | ICD-10-CM | POA: Diagnosis not present

## 2019-03-31 DIAGNOSIS — D649 Anemia, unspecified: Secondary | ICD-10-CM | POA: Diagnosis not present

## 2019-04-06 ENCOUNTER — Other Ambulatory Visit: Payer: Self-pay | Admitting: Cardiology

## 2019-04-07 DIAGNOSIS — R0602 Shortness of breath: Secondary | ICD-10-CM | POA: Diagnosis not present

## 2019-04-07 DIAGNOSIS — R918 Other nonspecific abnormal finding of lung field: Secondary | ICD-10-CM | POA: Diagnosis not present

## 2019-04-07 DIAGNOSIS — J449 Chronic obstructive pulmonary disease, unspecified: Secondary | ICD-10-CM | POA: Diagnosis not present

## 2019-04-07 DIAGNOSIS — F17210 Nicotine dependence, cigarettes, uncomplicated: Secondary | ICD-10-CM | POA: Diagnosis not present

## 2019-04-14 DIAGNOSIS — R911 Solitary pulmonary nodule: Secondary | ICD-10-CM | POA: Diagnosis not present

## 2019-04-14 DIAGNOSIS — D72819 Decreased white blood cell count, unspecified: Secondary | ICD-10-CM | POA: Diagnosis not present

## 2019-04-14 DIAGNOSIS — I2510 Atherosclerotic heart disease of native coronary artery without angina pectoris: Secondary | ICD-10-CM | POA: Diagnosis not present

## 2019-04-14 DIAGNOSIS — R590 Localized enlarged lymph nodes: Secondary | ICD-10-CM | POA: Diagnosis not present

## 2019-04-14 DIAGNOSIS — D649 Anemia, unspecified: Secondary | ICD-10-CM | POA: Diagnosis not present

## 2019-04-14 DIAGNOSIS — I700 Atherosclerosis of aorta: Secondary | ICD-10-CM | POA: Diagnosis not present

## 2019-04-14 DIAGNOSIS — J9809 Other diseases of bronchus, not elsewhere classified: Secondary | ICD-10-CM | POA: Diagnosis not present

## 2019-04-14 DIAGNOSIS — K8020 Calculus of gallbladder without cholecystitis without obstruction: Secondary | ICD-10-CM | POA: Diagnosis not present

## 2019-04-26 DIAGNOSIS — R591 Generalized enlarged lymph nodes: Secondary | ICD-10-CM | POA: Diagnosis not present

## 2019-04-26 DIAGNOSIS — Z23 Encounter for immunization: Secondary | ICD-10-CM | POA: Diagnosis not present

## 2019-04-28 ENCOUNTER — Other Ambulatory Visit: Payer: Self-pay

## 2019-04-28 ENCOUNTER — Ambulatory Visit (INDEPENDENT_AMBULATORY_CARE_PROVIDER_SITE_OTHER): Payer: Medicare Other | Admitting: Cardiology

## 2019-04-28 VITALS — BP 122/60 | HR 62 | Ht 72.0 in | Wt 264.0 lb

## 2019-04-28 DIAGNOSIS — I2510 Atherosclerotic heart disease of native coronary artery without angina pectoris: Secondary | ICD-10-CM

## 2019-04-28 DIAGNOSIS — E782 Mixed hyperlipidemia: Secondary | ICD-10-CM

## 2019-04-28 DIAGNOSIS — I420 Dilated cardiomyopathy: Secondary | ICD-10-CM

## 2019-04-28 DIAGNOSIS — I10 Essential (primary) hypertension: Secondary | ICD-10-CM

## 2019-04-28 DIAGNOSIS — F17200 Nicotine dependence, unspecified, uncomplicated: Secondary | ICD-10-CM

## 2019-04-28 NOTE — Patient Instructions (Signed)
Medication Instructions:  Your physician recommends that you continue on your current medications as directed. Please refer to the Current Medication list given to you today.  If you need a refill on your cardiac medications before your next appointment, please call your pharmacy.   Lab work: None Ordered   Testing/Procedures: Your physician has requested that you have an echocardiogram. Echocardiography is a painless test that uses sound waves to create images of your heart. It provides your doctor with information about the size and shape of your heart and how well your heart's chambers and valves are working. This procedure takes approximately one hour. There are no restrictions for this procedure.    Follow-Up: At [facility], you and your health needs are our priority.  As part of our continuing mission to provide you with exceptional heart care, we have created designated Provider Care Teams.  These Care Teams include your primary Cardiologist (physician) and Advanced Practice Providers (APPs -  Physician Assistants and Nurse Practitioners) who all work together to provide you with the care you need, when you need it. You will need a follow up appointment in 5 months.  Please call our office 2 months in advance to schedule this appointment.  You may see Dr. Agustin Cree or another member of our [facility] Provider Team in [place]: Shirlee More, MD . Jyl Heinz, MD

## 2019-04-28 NOTE — Progress Notes (Signed)
Cardiology Office Note:    Date:  04/28/2019   ID:  Antonio Benson, DOB Dec 08, 1955, MRN [id number]  PCP:  [name], MD  Cardiologist:  [name], MD    Referring MD: [name], MD   Chief Complaint  Patient presents with  . Follow-up  Doing well  History of Present Illness:    Antonio Benson is a 63 y.o. male with remote coronary artery disease, cardiomyopathy with normalization, chronic smoking, COPD, dyslipidemia.  Comes today to my office for follow-up recently he was identified to have some enlarged lymph node and biopsy is contemplated.  There is suspicion for lymphoma.  He lost about 50 pounds since I seen him last time but he said that is intentional however he does not look healthy.  Again he is scheduled to have biopsy of lymph nodes to try to establish diagnosis.  Past Medical History:  Diagnosis Date  . COPD (chronic obstructive pulmonary disease) ([place])   . Hyperlipidemia   . Hypertension   . Sinus trouble   . Sleep apnea     Past Surgical History:  Procedure Laterality Date  . KNEE SURGERY    . NASAL SINUS SURGERY  1990's   deviated septum     Current Medications: Current Meds  Medication Sig  . albuterol (PROVENTIL HFA;VENTOLIN HFA) 108 (90 BASE) MCG/ACT inhaler Inhale 2 puffs into the lungs every 6 (six) hours as needed for wheezing or shortness of breath.  Marland Kitchen albuterol (PROVENTIL) (2.5 MG/3ML) 0.083% nebulizer solution Take 3 mLs (2.5 mg total) by nebulization every 6 (six) hours as needed for wheezing or shortness of breath.  Marland Kitchen aspirin 81 MG tablet Take 81 mg by mouth daily.  Marland Kitchen atorvastatin (LIPITOR) 80 MG tablet TAKE 1 TABLET BY MOUTH EVERY DAY  . carvedilol (COREG) 3.125 MG tablet TAKE 1 TABLET (3.125 MG TOTAL) BY MOUTH 2 (TWO) TIMES DAILY.  . nitroGLYCERIN (NITROSTAT) 0.4 MG SL tablet PLACE 1 TABLET (0.4 MG TOTAL) UNDER THE TONGUE EVERY 5 (FIVE) MINUTES AS NEEDED FOR CHEST PAIN.  . TRELEGY ELLIPTA 100-62.5-25 MCG/INH AEPB  Inhale 1 puff into the lungs daily.     Allergies:   Patient has no known allergies.   Social History   Socioeconomic History  . Marital status: Married    Spouse name: Not on file  . Number of children: Not on file  . Years of education: Not on file  . Highest education level: Not on file  Occupational History  . Occupation: mail carrier  Social Needs  . Financial resource strain: Not on file  . Food insecurity    Worry: Not on file    Inability: Not on file  . Transportation needs    Medical: Not on file    Non-medical: Not on file  Tobacco Use  . Smoking status: Current Every Day Smoker    Packs/day: 1.00    Years: 40.00    Pack years: 40.00    Types: Cigarettes  . Smokeless tobacco: Never Used  Substance and Sexual Activity  . Alcohol use: No  . Drug use: No  . Sexual activity: Not on file  Lifestyle  . Physical activity    Days per week: Not on file    Minutes per session: Not on file  . Stress: Not on file  Relationships  . Social Herbalist on phone: Not on file    Gets together: Not on file    Attends religious  service: Not on file    Active member of club or organization: Not on file    Attends meetings of clubs or organizations: Not on file    Relationship status: Not on file  Other Topics Concern  . Not on file  Social History Narrative  . Not on file     Family History: The patient's family history includes Aneurysm in his father; Asthma in his mother. ROS:   Please see the history of present illness.    All 14 point review of systems negative except as described per history of present illness  EKGs/Labs/Other Studies Reviewed:      Recent Labs: 07/20/2018: ALT 11  Recent Lipid Panel    Component Value Date/Time   CHOL 88 (L) 07/20/2018 1038   TRIG 131 07/20/2018 1038   HDL 24 (L) 07/20/2018 1038   CHOLHDL 3.7 07/20/2018 1038   LDLCALC 38 07/20/2018 1038    Physical Exam:    VS:  BP 122/60   Pulse 62   Ht 6' (1.829  m)   Wt 264 lb (119.7 kg)   SpO2 98%   BMI 35.80 kg/m     Wt Readings from Last 3 Encounters:  04/28/19 264 lb (119.7 kg)  07/20/18 (!) 319 lb 3.2 oz (144.8 kg)  10/30/17 (!) 324 lb (147 kg)     GEN:  Well nourished, well developed in no acute distress HEENT: Normal NECK: No JVD; No carotid bruits LYMPHATICS: No lymphadenopathy CARDIAC: RRR, no murmurs, no rubs, no gallops RESPIRATORY:  Clear to auscultation without rales, wheezing or rhonchi  ABDOMEN: Soft, non-tender, non-distended MUSCULOSKELETAL:  No edema; No deformity  SKIN: Warm and dry LOWER EXTREMITIES: no swelling NEUROLOGIC:  Alert and oriented x 3 PSYCHIATRIC:  Normal affect   ASSESSMENT:    1. Coronary artery disease involving native coronary artery of native heart without angina pectoris   2. Essential hypertension   3. Dilated cardiomyopathy ([place])   4. Mixed hyperlipidemia   5. Tobacco dependence syndrome    PLAN:    In order of problems listed above:  1. Coronary disease stable from that point he denies having any issues now.  He exercise on a regular basis of no difficulty doing it. 2. Essential hypertension blood pressure well controlled continue present management. 3. History of cardiomyopathy however normalization.  Echocardiogram will be repeated to confirm the fact that his ejection fraction still normal.  Recently we needed to stop lisinopril because of episodes of orthostatic hypotension. 4. Mixed dyslipidemia.  He is taking Lipitor which I will continue 5. Smoking still ongoing 1 pack/day again we had long discussion about this I strongly recommend to quit.  He said he will try   Medication Adjustments/Labs and Tests Ordered: Current medicines are reviewed at length with the patient today.  Concerns regarding medicines are outlined above.  No orders of the defined types were placed in this encounter.  Medication changes: No orders of the defined types were placed in this encounter.   Signed,  Park Liter, MD, [facility] 04/28/2019 3:18 PM    [place]

## 2019-04-28 NOTE — Addendum Note (Signed)
Addended by: Polly Cobia A on: 04/28/2019 03:28 PM   Modules accepted: Orders

## 2019-04-30 ENCOUNTER — Other Ambulatory Visit: Payer: Self-pay | Admitting: Cardiology

## 2019-05-13 ENCOUNTER — Other Ambulatory Visit: Payer: Self-pay | Admitting: Thoracic Surgery (Cardiothoracic Vascular Surgery)

## 2019-05-13 ENCOUNTER — Institutional Professional Consult (permissible substitution) (INDEPENDENT_AMBULATORY_CARE_PROVIDER_SITE_OTHER): Payer: Medicare Other | Admitting: Thoracic Surgery (Cardiothoracic Vascular Surgery)

## 2019-05-13 ENCOUNTER — Other Ambulatory Visit: Payer: Self-pay

## 2019-05-13 ENCOUNTER — Encounter: Payer: Self-pay | Admitting: Thoracic Surgery (Cardiothoracic Vascular Surgery)

## 2019-05-13 VITALS — BP 151/87 | HR 74 | Temp 97.3°F | Resp 20 | Ht 72.0 in | Wt 256.0 lb

## 2019-05-13 DIAGNOSIS — I2510 Atherosclerotic heart disease of native coronary artery without angina pectoris: Secondary | ICD-10-CM | POA: Diagnosis not present

## 2019-05-13 DIAGNOSIS — R590 Localized enlarged lymph nodes: Secondary | ICD-10-CM

## 2019-05-13 NOTE — H&P (View-Only) (Signed)
PCP is [name], MD Referring Provider is Theuerkorn, Christean Grief, NP  Chief Complaint  Patient presents with  . Coronary Artery Disease    Consultation    HPI: Mr. Antonio Benson is sent for consultation regarding mediastinal adenopathy.  Royalty Antonio Benson is a 63 year old gentleman with a past medical history significant for hypertension, hyperlipidemia, MI, COPD, obstructive sleep apnea, and morbid obesity.  He has lost about 60 pounds over the past year with a low carbohydrate diet.  He says he is no longer having to use CPAP.  He does smoke a pack and a half of cigarettes daily.  In September 2019 he had a PET/CT.  He had a CT scan which sounds like it was done for lung cancer screening.  That led to a PET/CT.  The PET/CT showed no mediastinal adenopathy.  There were some tree-in-bud abnormalities in the lungs consistent with atypical infection.  He also was noted to have splenomegaly.  Recently he been getting dizzy when standing up.  That resolved after he stopped his lisinopril.  During the evaluation of the dizziness, he had laboratory testing which showed anemia and hypercalcemia.  He recently had a CT of the chest, abdomen and pelvis which showed some very mild enlargement of mediastinal lymph nodes, improvement of the pulmonary changes from a year prior, along with some coronary disease and aortic atherosclerosis.  Zubrod Score: At the time of surgery this patient's most appropriate activity status/level should be described as: []     0    Normal activity, no symptoms [x]     1    Restricted in physical strenuous activity but ambulatory, able to do out light work []     2    Ambulatory and capable of self care, unable to do work activities, up and about >50 % of waking hours                              []     3    Only limited self care, in bed greater than 50% of waking hours []     4    Completely disabled, no self care, confined to bed or chair []     5    Moribund  Past Medical  History:  Diagnosis Date  . COPD (chronic obstructive pulmonary disease) ([place])   . Hyperlipidemia   . Hypertension   . Sinus trouble   . Sleep apnea     Past Surgical History:  Procedure Laterality Date  . KNEE SURGERY    . NASAL SINUS SURGERY  1990's   deviated septum     Family History  Problem Relation Age of Onset  . Asthma Mother   . Aneurysm Father     Social History Social History   Tobacco Use  . Smoking status: Current Every Day Smoker    Packs/day: 1.00    Years: 40.00    Pack years: 40.00    Types: Cigarettes  . Smokeless tobacco: Never Used  Substance Use Topics  . Alcohol use: No  . Drug use: No    Current Outpatient Medications  Medication Sig Dispense Refill  . albuterol (PROVENTIL HFA;VENTOLIN HFA) 108 (90 BASE) MCG/ACT inhaler Inhale 2 puffs into the lungs every 6 (six) hours as needed for wheezing or shortness of breath.    Marland Kitchen albuterol (PROVENTIL) (2.5 MG/3ML) 0.083% nebulizer solution Take 3 mLs (2.5 mg total) by nebulization every 6 (six) hours as needed for  wheezing or shortness of breath. 120 mL 12  . aspirin 81 MG tablet Take 81 mg by mouth daily.    Marland Kitchen atorvastatin (LIPITOR) 80 MG tablet TAKE 1 TABLET BY MOUTH EVERY DAY 60 tablet 0  . carvedilol (COREG) 3.125 MG tablet TAKE 1 TABLET (3.125 MG TOTAL) BY MOUTH 2 (TWO) TIMES DAILY. 60 tablet 5  . nitroGLYCERIN (NITROSTAT) 0.4 MG SL tablet PLACE 1 TABLET (0.4 MG TOTAL) UNDER THE TONGUE EVERY 5 (FIVE) MINUTES AS NEEDED FOR CHEST PAIN. 25 tablet 6  . TRELEGY ELLIPTA 100-62.5-25 MCG/INH AEPB Inhale 1 puff into the lungs daily.     No current facility-administered medications for this visit.     No Known Allergies  Review of Systems  Constitutional: Negative for activity change, appetite change and fatigue.       Has lost 50 pounds with low-carb diet, 30 pounds in last 3 months  HENT: Positive for hearing loss. Negative for trouble swallowing and voice change.   Eyes: Negative for visual  disturbance.  Respiratory: Positive for cough, shortness of breath (With exertion) and wheezing.   Cardiovascular: Negative for chest pain and leg swelling.  Gastrointestinal: Positive for abdominal pain (Reflux).  Genitourinary: Negative for difficulty urinating and dysuria.  Neurological: Positive for dizziness (Resolved after lisinopril discontinued). Negative for syncope and headaches.  Hematological: Negative for adenopathy. Bruises/bleeds easily.  All other systems reviewed and are negative.   BP (!) 151/87 (BP Location: Right Arm)   Pulse 74   Temp (!) 97.3 F (36.3 C) (Skin)   Resp 20   Ht 6' (1.829 m)   Wt 256 lb (116.1 kg)   SpO2 95% Comment: RA  BMI 34.72 kg/m  Physical Exam Vitals signs reviewed.  Constitutional:      General: He is not in acute distress.    Appearance: He is obese.  HENT:     Head: Normocephalic and atraumatic.     Ears:     Comments: Hard of hearing Eyes:     General: No scleral icterus.    Extraocular Movements: Extraocular movements intact.  Neck:     Musculoskeletal: Neck supple.  Cardiovascular:     Rate and Rhythm: Normal rate and regular rhythm.     Heart sounds: No murmur.  Pulmonary:     Effort: Pulmonary effort is normal. No respiratory distress.     Breath sounds: Wheezing (Bilateral) present.  Musculoskeletal:        General: No swelling.  Lymphadenopathy:     Cervical: No cervical adenopathy.  Skin:    General: Skin is warm and dry.  Neurological:     General: No focal deficit present.     Mental Status: He is alert and oriented to person, place, and time.     Cranial Nerves: No cranial nerve deficit.     Motor: No weakness.     Coordination: Coordination normal.    Diagnostic Tests: CT chest [facility] 04/12/2019 Official impression 1.  Numerous prominent mediastinal lymph nodes which are slightly increased in size when compared to prior PET CT dated 04/24/2018, the largest pretracheal node measuring 1.7 x 1.0 cm,  previously 1.4 x 0.6 cm.  There are additional tiny nodules or lymph nodes in the anterior mediastinum increased in conspicuity compared to prior examination and abnormal for patient age.  There is no abdominal or pelvic lymphadenopathy.  In conjunction with splenomegaly and reported clinical history of anemia findings are concerning for lymphoma although nonetheless nonspecific. 2.  There are  redemonstrated tiny clustered tree-in-bud and centrilobular nodules of the medial left upper lobe significantly improved when compared to prior examination.  Findings are consistent with improved atypical infection. 3.  Nonspecific infectious or inflammatory bronchial wall thickening. 4.  Coronary artery disease.  Aortic atherosclerosis. Signed Eddie Candle, MD  I personally reviewed the CT images and concur with the findings noted above  Impression: Terion Antonio Benson is a 63 year old man with a past history of tobacco abuse, COPD, hypertension, hyperlipidemia, MI, aortic atherosclerosis, gastroesophageal reflux, morbid obesity, and obstructive sleep apnea.  About a year ago he was evaluated for some abnormal chest CT findings.  He had no mediastinal adenopathy that time.  He did have rather remarkable splenomegaly.  He recently was being evaluated for dizziness.  Laboratory test showed anemia and hypercalcemia.  A CT of the chest showed improvement of his pulmonary changes compared to the PET/CT.  However, there were some very mildly enlarged mediastinal lymph nodes.  The differential diagnosis includes sinus histiocytosis, reactive lymphadenopathy, lymphoma, sarcoidosis, metastatic primary lung cancer, and infectious etiologies.  I discussed the option of surgical biopsy of the mediastinal lymph nodes with Mr. and Mrs. Antonio Benson.  Endobronchial ultrasound would be of no use with lymphoma being a prime consideration.  The best option would be mediastinoscopy.  They understand this would be diagnostic only, not  therapeutic.  Given the mild degree of the adenopathy, I am not sure there is going to be a definitive answer provided by mediastinoscopy..  I described the operative procedure to them.  They understand the need for general anesthesia and the incision to be used.  We would plan to do this on an outpatient basis.  I informed him of the indications, risks, benefits, and alternatives.  They understand the risks include, but not limited to death, MI, stroke, DVT, PE, bleeding, possible need for thoracotomy, pneumothorax, stroke, recurrent nerve injury, as well as possibility of other unforeseeable complications.  They do understand that the rate of these complications is extremely low with mediastinoscopy.  He wishes to think over whether he wants to undergo this procedure, and will call the office once he decides.  Plan: Check parathyroid hormone level Patient will call if he wishes to proceed with mediastinoscopy  Melrose Nakayama, MD [facility] [phone]

## 2019-05-13 NOTE — Progress Notes (Signed)
PCP is [name], MD Referring Provider is Theuerkorn, Christean Grief, NP  Chief Complaint  Patient presents with  . Coronary Artery Disease    Consultation    HPI: Antonio Benson is sent for consultation regarding mediastinal adenopathy.  Antonio Benson is a 63 year old gentleman with a past medical history significant for hypertension, hyperlipidemia, MI, COPD, obstructive sleep apnea, and morbid obesity.  He has lost about 60 pounds over the past year with a low carbohydrate diet.  He says he is no longer having to use CPAP.  He does smoke a pack and a half of cigarettes daily.  In September 2019 he had a PET/CT.  He had a CT scan which sounds like it was done for lung cancer screening.  That led to a PET/CT.  The PET/CT showed no mediastinal adenopathy.  There were some tree-in-bud abnormalities in the lungs consistent with atypical infection.  He also was noted to have splenomegaly.  Recently he been getting dizzy when standing up.  That resolved after he stopped his lisinopril.  During the evaluation of the dizziness, he had laboratory testing which showed anemia and hypercalcemia.  He recently had a CT of the chest, abdomen and pelvis which showed some very mild enlargement of mediastinal lymph nodes, improvement of the pulmonary changes from a year prior, along with some coronary disease and aortic atherosclerosis.  Antonio Benson Score: At the time of surgery this patient's most appropriate activity status/level should be described as: []     0    Normal activity, no symptoms [x]     1    Restricted in physical strenuous activity but ambulatory, able to do out light work []     2    Ambulatory and capable of self care, unable to do work activities, up and about >50 % of waking hours                              []     3    Only limited self care, in bed greater than 50% of waking hours []     4    Completely disabled, no self care, confined to bed or chair []     5    Moribund  Past Medical  History:  Diagnosis Date  . COPD (chronic obstructive pulmonary disease) ([place])   . Hyperlipidemia   . Hypertension   . Sinus trouble   . Sleep apnea     Past Surgical History:  Procedure Laterality Date  . KNEE SURGERY    . NASAL SINUS SURGERY  1990's   deviated septum     Family History  Problem Relation Age of Onset  . Asthma Mother   . Aneurysm Father     Social History Social History   Tobacco Use  . Smoking status: Current Every Day Smoker    Packs/day: 1.00    Years: 40.00    Pack years: 40.00    Types: Cigarettes  . Smokeless tobacco: Never Used  Substance Use Topics  . Alcohol use: No  . Drug use: No    Current Outpatient Medications  Medication Sig Dispense Refill  . albuterol (PROVENTIL HFA;VENTOLIN HFA) 108 (90 BASE) MCG/ACT inhaler Inhale 2 puffs into the lungs every 6 (six) hours as needed for wheezing or shortness of breath.    Marland Kitchen albuterol (PROVENTIL) (2.5 MG/3ML) 0.083% nebulizer solution Take 3 mLs (2.5 mg total) by nebulization every 6 (six) hours as needed for  wheezing or shortness of breath. 120 mL 12  . aspirin 81 MG tablet Take 81 mg by mouth daily.    Marland Kitchen atorvastatin (LIPITOR) 80 MG tablet TAKE 1 TABLET BY MOUTH EVERY DAY 60 tablet 0  . carvedilol (COREG) 3.125 MG tablet TAKE 1 TABLET (3.125 MG TOTAL) BY MOUTH 2 (TWO) TIMES DAILY. 60 tablet 5  . nitroGLYCERIN (NITROSTAT) 0.4 MG SL tablet PLACE 1 TABLET (0.4 MG TOTAL) UNDER THE TONGUE EVERY 5 (FIVE) MINUTES AS NEEDED FOR CHEST PAIN. 25 tablet 6  . TRELEGY ELLIPTA 100-62.5-25 MCG/INH AEPB Inhale 1 puff into the lungs daily.     No current facility-administered medications for this visit.     No Known Allergies  Review of Systems  Constitutional: Negative for activity change, appetite change and fatigue.       Has lost 50 pounds with low-carb diet, 30 pounds in last 3 months  HENT: Positive for hearing loss. Negative for trouble swallowing and voice change.   Eyes: Negative for visual  disturbance.  Respiratory: Positive for cough, shortness of breath (With exertion) and wheezing.   Cardiovascular: Negative for chest pain and leg swelling.  Gastrointestinal: Positive for abdominal pain (Reflux).  Genitourinary: Negative for difficulty urinating and dysuria.  Neurological: Positive for dizziness (Resolved after lisinopril discontinued). Negative for syncope and headaches.  Hematological: Negative for adenopathy. Bruises/bleeds easily.  All other systems reviewed and are negative.   BP (!) 151/87 (BP Location: Right Arm)   Pulse 74   Temp (!) 97.3 F (36.3 C) (Skin)   Resp 20   Ht 6' (1.829 m)   Wt 256 lb (116.1 kg)   SpO2 95% Comment: RA  BMI 34.72 kg/m  Physical Exam Vitals signs reviewed.  Constitutional:      General: He is not in acute distress.    Appearance: He is obese.  HENT:     Head: Normocephalic and atraumatic.     Ears:     Comments: Hard of hearing Eyes:     General: No scleral icterus.    Extraocular Movements: Extraocular movements intact.  Neck:     Musculoskeletal: Neck supple.  Cardiovascular:     Rate and Rhythm: Normal rate and regular rhythm.     Heart sounds: No murmur.  Pulmonary:     Effort: Pulmonary effort is normal. No respiratory distress.     Breath sounds: Wheezing (Bilateral) present.  Musculoskeletal:        General: No swelling.  Lymphadenopathy:     Cervical: No cervical adenopathy.  Skin:    General: Skin is warm and dry.  Neurological:     General: No focal deficit present.     Mental Status: He is alert and oriented to person, place, and time.     Cranial Nerves: No cranial nerve deficit.     Motor: No weakness.     Coordination: Coordination normal.    Diagnostic Tests: CT chest [facility] 04/12/2019 Official impression 1.  Numerous prominent mediastinal lymph nodes which are slightly increased in size when compared to prior PET CT dated 04/24/2018, the largest pretracheal node measuring 1.7 x 1.0 cm,  previously 1.4 x 0.6 cm.  There are additional tiny nodules or lymph nodes in the anterior mediastinum increased in conspicuity compared to prior examination and abnormal for patient age.  There is no abdominal or pelvic lymphadenopathy.  In conjunction with splenomegaly and reported clinical history of anemia findings are concerning for lymphoma although nonetheless nonspecific. 2.  There are  redemonstrated tiny clustered tree-in-bud and centrilobular nodules of the medial left upper lobe significantly improved when compared to prior examination.  Findings are consistent with improved atypical infection. 3.  Nonspecific infectious or inflammatory bronchial wall thickening. 4.  Coronary artery disease.  Aortic atherosclerosis. Signed Eddie Candle, MD  I personally reviewed the CT images and concur with the findings noted above  Impression: Antonio Benson is a 63 year old man with a past history of tobacco abuse, COPD, hypertension, hyperlipidemia, MI, aortic atherosclerosis, gastroesophageal reflux, morbid obesity, and obstructive sleep apnea.  About a year ago he was evaluated for some abnormal chest CT findings.  He had no mediastinal adenopathy that time.  He did have rather remarkable splenomegaly.  He recently was being evaluated for dizziness.  Laboratory test showed anemia and hypercalcemia.  A CT of the chest showed improvement of his pulmonary changes compared to the PET/CT.  However, there were some very mildly enlarged mediastinal lymph nodes.  The differential diagnosis includes sinus histiocytosis, reactive lymphadenopathy, lymphoma, sarcoidosis, metastatic primary lung cancer, and infectious etiologies.  I discussed the option of surgical biopsy of the mediastinal lymph nodes with Mr. and Mrs. Antonio Benson.  Endobronchial ultrasound would be of no use with lymphoma being a prime consideration.  The best option would be mediastinoscopy.  They understand this would be diagnostic only, not  therapeutic.  Given the mild degree of the adenopathy, I am not sure there is going to be a definitive answer provided by mediastinoscopy..  I described the operative procedure to them.  They understand the need for general anesthesia and the incision to be used.  We would plan to do this on an outpatient basis.  I informed him of the indications, risks, benefits, and alternatives.  They understand the risks include, but not limited to death, MI, stroke, DVT, PE, bleeding, possible need for thoracotomy, pneumothorax, stroke, recurrent nerve injury, as well as possibility of other unforeseeable complications.  They do understand that the rate of these complications is extremely low with mediastinoscopy.  He wishes to think over whether he wants to undergo this procedure, and will call the office once he decides.  Plan: Check parathyroid hormone level Patient will call if he wishes to proceed with mediastinoscopy  Melrose Nakayama, MD [facility] [phone]

## 2019-05-17 ENCOUNTER — Other Ambulatory Visit: Payer: Self-pay | Admitting: *Deleted

## 2019-05-17 DIAGNOSIS — R590 Localized enlarged lymph nodes: Secondary | ICD-10-CM

## 2019-05-19 NOTE — Progress Notes (Signed)
[id number] [address] [place] [address] Phone: [phone] Fax: [phone]      Your procedure is scheduled on October 26th, 2020.  Report to [facility] Main Entrance "A" at 5:30 A.M., and check in at the Admitting office.  Call this number if you have problems the morning of surgery:  [phone]  Call [phone] if you have any questions prior to your surgery date Monday-Friday 8am-4pm    Remember:  Do not eat or drink after midnight the night before your surgery   Take these medicines the morning of surgery with A SIP OF WATER :  Albuterol (Proventil) inhaler - if needed (bring with you day of surgery) Carvedilol (Coreg) Nitroglycerin (Nitrostat) - if needed Sodium Chloride (Ocean) nasal spray Trelegy Ellipta inhaler  7 days prior to surgery STOP taking any Aspirin (unless otherwise instructed by your surgeon), Aleve, Naproxen, Ibuprofen, Motrin, Advil, Goody's, BC's, all herbal medications, fish oil, and all vitamins.    The Morning of Surgery  Do not wear jewelry, make-up or nail polish.  Do not wear lotions, powders, or perfumes/colognes, or deodorant  Do not shave 48 hours prior to surgery.  Men may shave face and neck.  Do not bring valuables to the hospital.  [facility] is not responsible for any belongings or valuables.  If you are a smoker, DO NOT Smoke 24 hours prior to surgery IF you wear a CPAP at night please bring your mask, tubing, and machine the morning of surgery   Remember that you must have someone to transport you home after your surgery, and remain with you for 24 hours if you are discharged the same day.   Contacts, glasses, hearing aids, dentures or bridgework may not be worn into surgery.    Leave your suitcase in the car.  After surgery it may be brought to your room.  For patients admitted to the hospital, discharge time will be determined by your treatment  team.  Patients discharged the day of surgery will not be allowed to drive home.    Special instructions:   [facility]- Preparing For Surgery  Before surgery, you can play an important role. Because skin is not sterile, your skin needs to be as free of germs as possible. You can reduce the number of germs on your skin by washing with CHG (chlorahexidine gluconate) Soap before surgery.  CHG is an antiseptic cleaner which kills germs and bonds with the skin to continue killing germs even after washing.    Oral Hygiene is also important to reduce your risk of infection.  Remember - BRUSH YOUR TEETH THE MORNING OF SURGERY WITH YOUR REGULAR TOOTHPASTE  Please do not use if you have an allergy to CHG or antibacterial soaps. If your skin becomes reddened/irritated stop using the CHG.  Do not shave (including legs and underarms) for at least 48 hours prior to first CHG shower. It is OK to shave your face.  Please follow these instructions carefully.   1. Shower the NIGHT BEFORE SURGERY and the MORNING OF SURGERY with CHG Soap.   2. If you chose to wash your hair, wash your hair first as usual with your normal shampoo.  3. After you shampoo, rinse your hair and body thoroughly to remove the shampoo.  4. Use CHG as you would any other liquid soap. You can apply CHG directly to the skin and wash gently with a scrungie or a clean washcloth.  5. Apply the CHG Soap to your body ONLY FROM THE NECK DOWN.  Do not use on open wounds or open sores. Avoid contact with your eyes, ears, mouth and genitals (private parts). Wash Face and genitals (private parts)  with your normal soap.   6. Wash thoroughly, paying special attention to the area where your surgery will be performed.  7. Thoroughly rinse your body with warm water from the neck down.  8. DO NOT shower/wash with your normal soap after using and rinsing off the CHG Soap.  9. Pat yourself dry with a CLEAN TOWEL.  10. Wear CLEAN PAJAMAS to bed  the night before surgery, wear comfortable clothes the morning of surgery  11. Place CLEAN SHEETS on your bed the night of your first shower and DO NOT SLEEP WITH PETS.    Day of Surgery:  Do not apply any deodorants/lotions. Please shower the morning of surgery with the CHG soap  Please wear clean clothes to the hospital/surgery center.   Remember to brush your teeth WITH YOUR REGULAR TOOTHPASTE.   Please read over the following fact sheets that you were given.

## 2019-05-20 ENCOUNTER — Encounter (HOSPITAL_COMMUNITY): Payer: Self-pay

## 2019-05-20 ENCOUNTER — Encounter (HOSPITAL_COMMUNITY)
Admission: RE | Admit: 2019-05-20 | Discharge: 2019-05-20 | Disposition: A | Discharge status: Home / Self Care | Payer: Medicare Other | Source: Ambulatory Visit | Attending: Thoracic Surgery (Cardiothoracic Vascular Surgery) | Admitting: Thoracic Surgery (Cardiothoracic Vascular Surgery)

## 2019-05-20 ENCOUNTER — Ambulatory Visit (HOSPITAL_COMMUNITY)
Admission: RE | Admit: 2019-05-20 | Discharge: 2019-05-20 | Disposition: A | Discharge status: Home / Self Care | Payer: Medicare Other | Source: Ambulatory Visit | Attending: Thoracic Surgery (Cardiothoracic Vascular Surgery) | Admitting: Thoracic Surgery (Cardiothoracic Vascular Surgery)

## 2019-05-20 ENCOUNTER — Other Ambulatory Visit (HOSPITAL_COMMUNITY)
Admission: RE | Admit: 2019-05-20 | Discharge: 2019-05-20 | Disposition: A | Discharge status: Home / Self Care | Payer: Medicare Other | Source: Ambulatory Visit | Attending: Thoracic Surgery (Cardiothoracic Vascular Surgery) | Admitting: Thoracic Surgery (Cardiothoracic Vascular Surgery)

## 2019-05-20 ENCOUNTER — Other Ambulatory Visit: Payer: Self-pay

## 2019-05-20 ENCOUNTER — Other Ambulatory Visit (HOSPITAL_COMMUNITY): Payer: Medicare Other

## 2019-05-20 DIAGNOSIS — Z01818 Encounter for other preprocedural examination: Secondary | ICD-10-CM | POA: Insufficient documentation

## 2019-05-20 DIAGNOSIS — R590 Localized enlarged lymph nodes: Secondary | ICD-10-CM

## 2019-05-20 DIAGNOSIS — Z20828 Contact with and (suspected) exposure to other viral communicable diseases: Secondary | ICD-10-CM | POA: Diagnosis not present

## 2019-05-20 DIAGNOSIS — I10 Essential (primary) hypertension: Secondary | ICD-10-CM | POA: Diagnosis not present

## 2019-05-20 DIAGNOSIS — R001 Bradycardia, unspecified: Secondary | ICD-10-CM | POA: Insufficient documentation

## 2019-05-20 HISTORY — DX: Unspecified osteoarthritis, unspecified site: M19.90

## 2019-05-20 HISTORY — DX: Pneumonia, unspecified organism: J18.9

## 2019-05-20 HISTORY — DX: Atherosclerotic heart disease of native coronary artery without angina pectoris: I25.10

## 2019-05-20 HISTORY — DX: Anemia, unspecified: D64.9

## 2019-05-20 HISTORY — DX: Acute myocardial infarction, unspecified: I21.9

## 2019-05-20 HISTORY — DX: Headache, unspecified: R51.9

## 2019-05-20 HISTORY — DX: Gastro-esophageal reflux disease without esophagitis: K21.9

## 2019-05-20 LAB — PROTIME-INR
INR: 1.1 (ref 0.8–1.2)
Prothrombin Time: 14 seconds (ref 11.4–15.2)

## 2019-05-20 LAB — TYPE AND SCREEN
ABO/RH(D): AB POS
Antibody Screen: NEGATIVE

## 2019-05-20 LAB — COMPREHENSIVE METABOLIC PANEL
ALT: 23 U/L (ref 0–44)
AST: 22 U/L (ref 15–41)
Albumin: 3.9 g/dL (ref 3.5–5.0)
Alkaline Phosphatase: 109 U/L (ref 38–126)
Anion gap: 10 (ref 5–15)
BUN: 18 mg/dL (ref 8–23)
CO2: 26 mmol/L (ref 22–32)
Calcium: 11.1 mg/dL — ABNORMAL HIGH (ref 8.9–10.3)
Chloride: 104 mmol/L (ref 98–111)
Creatinine, Ser: 1.42 mg/dL — ABNORMAL HIGH (ref 0.61–1.24)
GFR calc Af Amer: >60 mL/min (ref >60)
GFR calc non Af Amer: 53 mL/min — ABNORMAL LOW (ref >60)
Glucose, Bld: 106 mg/dL — ABNORMAL HIGH (ref 70–99)
Potassium: 4.1 mmol/L (ref 3.5–5.1)
Sodium: 140 mmol/L (ref 135–145)
Total Bilirubin: 1 mg/dL (ref 0.3–1.2)
Total Protein: 6.1 g/dL — ABNORMAL LOW (ref 6.5–8.1)

## 2019-05-20 LAB — CBC
HCT: 34.4 % — ABNORMAL LOW (ref 39.0–52.0)
Hemoglobin: 10.6 g/dL — ABNORMAL LOW (ref 13.0–17.0)
MCH: 27.8 pg (ref 26.0–34.0)
MCHC: 30.8 g/dL (ref 30.0–36.0)
MCV: 90.3 fL (ref 80.0–100.0)
Platelets: 164 10*3/uL (ref 150–400)
RBC: 3.81 MIL/uL — ABNORMAL LOW (ref 4.22–5.81)
RDW: 19.8 % — ABNORMAL HIGH (ref 11.5–15.5)
WBC: 4 10*3/uL (ref 4.0–10.5)
nRBC: 0 % (ref 0.0–0.2)

## 2019-05-20 LAB — SURGICAL PCR SCREEN
MRSA, PCR: NEGATIVE
Staphylococcus aureus: NEGATIVE

## 2019-05-20 LAB — APTT: aPTT: 33 seconds (ref 24–36)

## 2019-05-20 LAB — ABO/RH: ABO/RH(D): AB POS

## 2019-05-20 NOTE — Pre-Procedure Instructions (Signed)
Antonio Benson  05/20/2019    Your procedure is scheduled on Monday, May 24, 2019 at 7:30 AM.   Report to [facility] Entrance "A" Admitting Office at 5:30 AM.   Call this number if you have problems the morning of surgery: [phone]   Questions prior to day of surgery, please call [phone] between 8 & 4 PM.   Remember:  Do not eat or drink after midnight Sunday, 05/23/19.             Take these medicines the morning of surgery with A SIP OF WATER: Carvedilol (Coreg), Trelegy Ellipta inhaler, Albuterol nebulizer - if needed, Albuterol inhaler - if needed (bring this inhaler with you day of surgery), Ocean nasal spray - if needed, Nitroglycerin - if needed.  Stop Multivitamins as of today. Do not use NSAIDS (Ibuprofen, Aleve, etc), Herbal medications or Fish oil prior to surgery.  Do NOT smoke 24 hours prior to surgery.    Do not wear jewelry.  Do not wear lotions, powders, cologne or deodorant.  Men may shave face and neck.  Do not bring valuables to the hospital.  [facility] is not responsible for any belongings or valuables.  Contacts, dentures or bridgework may not be worn into surgery.  Leave your suitcase in the car.  After surgery it may be brought to your room.  For patients admitted to the hospital, discharge time will be determined by your treatment team.  Patients discharged the day of surgery will not be allowed to drive home.   [facility] - Preparing for Surgery  Before surgery, you can play an important role.  Because skin is not sterile, your skin needs to be as free of germs as possible.  You can reduce the number of germs on you skin by washing with CHG (chlorahexidine gluconate) soap before surgery.  CHG is an antiseptic cleaner which kills germs and bonds with the skin to continue killing germs even after washing.  Oral Hygiene is also important in reducing the risk of infection.  Remember to brush your teeth with your regular toothpaste  the morning of surgery.  Please DO NOT use if you have an allergy to CHG or antibacterial soaps.  If your skin becomes reddened/irritated stop using the CHG and inform your nurse when you arrive at Short Stay.  Do not shave (including legs and underarms) for at least 48 hours prior to the first CHG shower.  You may shave your face.  Please follow these instructions carefully:   1.  Shower with CHG Soap the night before surgery and the morning of Surgery.  2.  If you choose to wash your hair, wash your hair first as usual with your normal shampoo.  3.  After you shampoo, rinse your hair and body thoroughly to remove the shampoo. 4.  Use CHG as you would any other liquid soap.  You can apply chg directly to the skin and wash gently with a      scrungie or washcloth.           5.  Apply the CHG Soap to your body ONLY FROM THE NECK DOWN.   Do not use on open wounds or open sores. Avoid contact with your eyes, ears, mouth and genitals (private parts).  Wash genitals (private parts) with your normal soap - do this prior to using CHG soap.  6.  Wash thoroughly, paying special attention to the area where your surgery will be performed.  7.  Thoroughly rinse your body with warm water from the neck down.  8.  DO NOT shower/wash with your normal soap after using and rinsing off the CHG Soap.  9.  Pat yourself dry with a clean towel.            10.  Wear clean pajamas.            11.  Place clean sheets on your bed the night of your first shower and do not sleep with pets.  Day of Surgery  Shower as above. Do not apply any lotions/deodorants the morning of surgery.   Please wear clean clothes to the hospital. Remember to brush your teeth with toothpaste.  Please read over the fact sheets that you were given.

## 2019-05-20 NOTE — Progress Notes (Addendum)
PCP - Dr. Lovette Cliche Cardiologist - Dr. [name] Pulmonologist - Dr. Glade Lloyd  PPM/ICD - N/A Device Orders -  Rep Notified -   Chest x-ray - today EKG - today Stress Test - N/A ECHO - 02/04/18 ( has one scheduled for 06/03/19 Cardiac Cath - pt states 2015 or 2016. Have sent request to [facility], can't find it in Care Everywhere  Sleep Study - years ago CPAP - no, since losing weight  Fasting Blood Sugar - N/A Checks Blood Sugar _____ times a day  Blood Thinner Instructions: N/A Aspirin Instructions: N/A  ERAS Protcol - N/A PRE-SURGERY Ensure or G2-   COVID TEST- 05/20/19, pt given quarantine instructions   Anesthesia review: yes, heart hx.  Patient denies shortness of breath, fever, cough and chest pain at PAT appointment   All instructions explained to the patient, with a verbal understanding of the material. Patient agrees to go over the instructions while at home for a better understanding. Patient also instructed to self quarantine after being tested for COVID-19. The opportunity to ask questions was provided.

## 2019-05-21 LAB — PTH, INTACT AND CALCIUM
Calcium, Total (PTH): 11.3 mg/dL — ABNORMAL HIGH (ref 8.6–10.2)
PTH: 7 pg/mL — ABNORMAL LOW (ref 15–65)

## 2019-05-21 NOTE — Anesthesia Preprocedure Evaluation (Addendum)
Anesthesia Evaluation  Patient identified by MRN, date of birth, ID band Patient awake    Reviewed: Allergy & Precautions, NPO status , Patient's Chart, lab work & pertinent test results  Airway Mallampati: II  TM Distance: >3 FB     Dental   Pulmonary Current Smoker and Patient abstained from smoking.,    breath sounds clear to auscultation       Cardiovascular hypertension, + CAD and + Past MI   Rhythm:Regular Rate:Normal     Neuro/Psych    GI/Hepatic GERD  ,  Endo/Other    Renal/GU      Musculoskeletal   Abdominal   Peds  Hematology  (+) anemia ,   Anesthesia Other Findings   Reproductive/Obstetrics                           Anesthesia Physical Anesthesia Plan  ASA: III  Anesthesia Plan: General   Post-op Pain Management:    Induction: Intravenous  PONV Risk Score and Plan: 1 and Ondansetron, Dexamethasone and Midazolam  Airway Management Planned: Oral ETT  Additional Equipment: Arterial line  Intra-op Plan:   Post-operative Plan: Possible Post-op intubation/ventilation  Informed Consent: I have reviewed the patients History and Physical, chart, labs and discussed the procedure including the risks, benefits and alternatives for the proposed anesthesia with the patient or authorized representative who has indicated his/her understanding and acceptance.     Dental advisory given  Plan Discussed with:   Anesthesia Plan Comments: (Follows with cardiology for remote CAD s/p MI with stent placement 2016, cardiomyopathy with normalization, HTN. Last seen by Dr. Agustin Cree 04/28/19, per note CAD stable, no symptoms, pt exercising regularly without difficulty, discussed upcoming biopsy for evaluation of lymphadenopathy.  Preop labs reviewed, mild renal insufficiency creatinine 1.42, mild anemia Hgb 10.6.  TTE 02/04/18: - Left ventricle: The cavity size was normal. Systolic function  was   normal. The estimated ejection fraction was in the range of 55%   to 60%. Wall motion was normal; there were no regional wall   motion abnormalities. Left ventricular diastolic function   parameters were normal.  Impressions:  - Grossly normal echocardiogram.)      Anesthesia Quick Evaluation

## 2019-05-21 NOTE — Progress Notes (Signed)
Anesthesia Chart Review: Follows with cardiology for remote CAD s/p MI with stent placement 2016, cardiomyopathy with normalization, HTN. Last seen by Dr. Agustin Cree 04/28/19, per note CAD stable, no symptoms, pt exercising regularly without difficulty, discussed upcoming biopsy for evaluation of lymphadenopathy.  Preop labs reviewed, mild renal insufficiency creatinine 1.42, mild anemia Hgb 10.6.  TTE 02/04/18: - Left ventricle: The cavity size was normal. Systolic function was   normal. The estimated ejection fraction was in the range of 55%   to 60%. Wall motion was normal; there were no regional wall   motion abnormalities. Left ventricular diastolic function   parameters were normal.  Impressions:  - Grossly normal echocardiogram.   Wynonia Musty [facility] Short Stay Center/Anesthesiology Phone [phone] 05/21/2019 9:08 AM

## 2019-05-23 LAB — NOVEL CORONAVIRUS, NAA (HOSP ORDER, SEND-OUT TO REF LAB; TAT 18-24 HRS): SARS-CoV-2, NAA: NOT DETECTED

## 2019-05-24 ENCOUNTER — Encounter (HOSPITAL_COMMUNITY): Payer: Self-pay | Admitting: *Deleted

## 2019-05-24 ENCOUNTER — Ambulatory Visit (HOSPITAL_COMMUNITY): Payer: Medicare Other | Admitting: Physician Assistant

## 2019-05-24 ENCOUNTER — Ambulatory Visit (HOSPITAL_COMMUNITY)
Admission: RE | Admit: 2019-05-24 | Discharge: 2019-05-24 | Disposition: A | Discharge status: Home / Self Care | Payer: Medicare Other | Attending: Thoracic Surgery (Cardiothoracic Vascular Surgery) | Admitting: Thoracic Surgery (Cardiothoracic Vascular Surgery)

## 2019-05-24 ENCOUNTER — Ambulatory Visit (HOSPITAL_COMMUNITY): Payer: Medicare Other | Admitting: Registered Nurse

## 2019-05-24 ENCOUNTER — Other Ambulatory Visit: Payer: Self-pay

## 2019-05-24 ENCOUNTER — Encounter (HOSPITAL_COMMUNITY)
Admission: RE | Disposition: A | Discharge status: Home / Self Care | Payer: Self-pay | Source: Home / Self Care | Attending: Thoracic Surgery (Cardiothoracic Vascular Surgery)

## 2019-05-24 DIAGNOSIS — R590 Localized enlarged lymph nodes: Secondary | ICD-10-CM | POA: Diagnosis not present

## 2019-05-24 DIAGNOSIS — G4733 Obstructive sleep apnea (adult) (pediatric): Secondary | ICD-10-CM | POA: Insufficient documentation

## 2019-05-24 DIAGNOSIS — I2510 Atherosclerotic heart disease of native coronary artery without angina pectoris: Secondary | ICD-10-CM | POA: Diagnosis not present

## 2019-05-24 DIAGNOSIS — R222 Localized swelling, mass and lump, trunk: Secondary | ICD-10-CM | POA: Diagnosis not present

## 2019-05-24 DIAGNOSIS — J449 Chronic obstructive pulmonary disease, unspecified: Secondary | ICD-10-CM | POA: Diagnosis not present

## 2019-05-24 DIAGNOSIS — Z79899 Other long term (current) drug therapy: Secondary | ICD-10-CM | POA: Insufficient documentation

## 2019-05-24 DIAGNOSIS — F17210 Nicotine dependence, cigarettes, uncomplicated: Secondary | ICD-10-CM | POA: Diagnosis not present

## 2019-05-24 DIAGNOSIS — I10 Essential (primary) hypertension: Secondary | ICD-10-CM | POA: Diagnosis not present

## 2019-05-24 DIAGNOSIS — E785 Hyperlipidemia, unspecified: Secondary | ICD-10-CM | POA: Insufficient documentation

## 2019-05-24 DIAGNOSIS — Z7951 Long term (current) use of inhaled steroids: Secondary | ICD-10-CM | POA: Insufficient documentation

## 2019-05-24 DIAGNOSIS — Z6835 Body mass index (BMI) 35.0-35.9, adult: Secondary | ICD-10-CM | POA: Insufficient documentation

## 2019-05-24 DIAGNOSIS — Z7982 Long term (current) use of aspirin: Secondary | ICD-10-CM | POA: Insufficient documentation

## 2019-05-24 DIAGNOSIS — I252 Old myocardial infarction: Secondary | ICD-10-CM | POA: Insufficient documentation

## 2019-05-24 DIAGNOSIS — C8513 Unspecified B-cell lymphoma, intra-abdominal lymph nodes: Secondary | ICD-10-CM | POA: Diagnosis not present

## 2019-05-24 HISTORY — PX: MEDIASTINOSCOPY: SHX5086

## 2019-05-24 SURGERY — MEDIASTINOSCOPY
Anesthesia: General

## 2019-05-24 MED ORDER — FENTANYL CITRATE (PF) 250 MCG/5ML IJ SOLN
INTRAMUSCULAR | Status: AC
  Filled 2019-05-24: qty 5

## 2019-05-24 MED ORDER — 0.9 % SODIUM CHLORIDE (POUR BTL) OPTIME
TOPICAL | Status: DC | PRN
  Administered 2019-05-24: 1000 mL

## 2019-05-24 MED ORDER — MIDAZOLAM HCL 2 MG/2ML IJ SOLN
INTRAMUSCULAR | Status: AC
  Filled 2019-05-24: qty 2

## 2019-05-24 MED ORDER — PROPOFOL 10 MG/ML IV BOLUS
INTRAVENOUS | Status: DC | PRN
  Administered 2019-05-24: 150 mg via INTRAVENOUS

## 2019-05-24 MED ORDER — ONDANSETRON HCL 4 MG/2ML IJ SOLN
INTRAMUSCULAR | Status: DC | PRN
  Administered 2019-05-24: 4 mg via INTRAVENOUS

## 2019-05-24 MED ORDER — DEXAMETHASONE SODIUM PHOSPHATE 10 MG/ML IJ SOLN
INTRAMUSCULAR | Status: DC | PRN
  Administered 2019-05-24: 4 mg via INTRAVENOUS

## 2019-05-24 MED ORDER — DEXAMETHASONE SODIUM PHOSPHATE 10 MG/ML IJ SOLN
INTRAMUSCULAR | Status: AC
  Filled 2019-05-24: qty 1

## 2019-05-24 MED ORDER — HEMOSTATIC AGENTS (NO CHARGE) OPTIME
TOPICAL | Status: DC | PRN
  Administered 2019-05-24: 2 via TOPICAL

## 2019-05-24 MED ORDER — OXYCODONE HCL 5 MG PO TABS: 5.0000 mg | ORAL_TABLET | Freq: Four times a day (QID) | ORAL | Status: DC | PRN

## 2019-05-24 MED ORDER — SUGAMMADEX SODIUM 200 MG/2ML IV SOLN
INTRAVENOUS | Status: DC | PRN
  Administered 2019-05-24: 300 mg via INTRAVENOUS

## 2019-05-24 MED ORDER — ROCURONIUM BROMIDE 10 MG/ML (PF) SYRINGE
PREFILLED_SYRINGE | INTRAVENOUS | Status: AC
  Filled 2019-05-24: qty 10

## 2019-05-24 MED ORDER — FENTANYL CITRATE (PF) 100 MCG/2ML IJ SOLN
INTRAMUSCULAR | Status: DC | PRN
  Administered 2019-05-24: 100 ug via INTRAVENOUS
  Administered 2019-05-24 (×3): 50 ug via INTRAVENOUS

## 2019-05-24 MED ORDER — ROCURONIUM BROMIDE 50 MG/5ML IV SOSY
PREFILLED_SYRINGE | INTRAVENOUS | Status: DC | PRN
  Administered 2019-05-24: 20 mg via INTRAVENOUS
  Administered 2019-05-24: 50 mg via INTRAVENOUS

## 2019-05-24 MED ORDER — ONDANSETRON HCL 4 MG/2ML IJ SOLN
INTRAMUSCULAR | Status: AC
  Filled 2019-05-24: qty 2

## 2019-05-24 MED ORDER — OXYCODONE HCL 5 MG PO TABS: 5.0000 mg | ORAL_TABLET | Freq: Four times a day (QID) | ORAL | 0 refills | Status: DC | PRN

## 2019-05-24 MED ORDER — MIDAZOLAM HCL 5 MG/5ML IJ SOLN: INTRAMUSCULAR | Status: DC | PRN

## 2019-05-24 MED ORDER — CEFAZOLIN SODIUM-DEXTROSE 2-4 GM/100ML-% IV SOLN
2.0000 g | INTRAVENOUS | Status: AC
  Administered 2019-05-24: 2 g via INTRAVENOUS
  Filled 2019-05-24: qty 100

## 2019-05-24 MED ORDER — HYDROMORPHONE HCL 1 MG/ML IJ SOLN: 0.2500 mg | INTRAMUSCULAR | Status: DC | PRN

## 2019-05-24 MED ORDER — PROPOFOL 10 MG/ML IV BOLUS
INTRAVENOUS | Status: AC
  Filled 2019-05-24: qty 20

## 2019-05-24 MED ORDER — PHENYLEPHRINE 40 MCG/ML (10ML) SYRINGE FOR IV PUSH (FOR BLOOD PRESSURE SUPPORT)
PREFILLED_SYRINGE | INTRAVENOUS | Status: AC
  Filled 2019-05-24: qty 10

## 2019-05-24 MED ORDER — LIDOCAINE 2% (20 MG/ML) 5 ML SYRINGE
INTRAMUSCULAR | Status: DC | PRN
  Administered 2019-05-24: 40 mg via INTRAVENOUS
  Administered 2019-05-24: 60 mg via INTRAVENOUS

## 2019-05-24 MED ORDER — LACTATED RINGERS IV SOLN
INTRAVENOUS | Status: DC | PRN
  Administered 2019-05-24 (×2): via INTRAVENOUS

## 2019-05-24 MED ORDER — MIDAZOLAM HCL 5 MG/5ML IJ SOLN
INTRAMUSCULAR | Status: DC | PRN
  Administered 2019-05-24: 2 mg via INTRAVENOUS

## 2019-05-24 MED ORDER — PHENYLEPHRINE 40 MCG/ML (10ML) SYRINGE FOR IV PUSH (FOR BLOOD PRESSURE SUPPORT)
PREFILLED_SYRINGE | INTRAVENOUS | Status: DC | PRN
  Administered 2019-05-24: 40 ug via INTRAVENOUS
  Administered 2019-05-24: 80 ug via INTRAVENOUS

## 2019-05-24 MED ORDER — LIDOCAINE 2% (20 MG/ML) 5 ML SYRINGE
INTRAMUSCULAR | Status: AC
  Filled 2019-05-24: qty 5

## 2019-05-24 SURGICAL SUPPLY — 43 items
APPLIER CLIP LOGIC TI 5 (MISCELLANEOUS) IMPLANT
BLADE 15 SAFETY STRL DISP (BLADE) ×3 IMPLANT
BLADE CLIPPER SURG (BLADE) ×3 IMPLANT
BLADE SURG 15 STRL LF DISP TIS (BLADE) ×1 IMPLANT
BLADE SURG 15 STRL SS (BLADE) ×2
CANISTER SUCT 3000ML PPV (MISCELLANEOUS) ×3 IMPLANT
CLEANER TIP ELECTROSURG 2X2 (MISCELLANEOUS) ×3 IMPLANT
CLIP VESOCCLUDE MED 6/CT (CLIP) ×3 IMPLANT
CONT SPEC 4OZ CLIKSEAL STRL BL (MISCELLANEOUS) ×15 IMPLANT
COVER SURGICAL LIGHT HANDLE (MISCELLANEOUS) ×3 IMPLANT
COVER WAND RF STERILE (DRAPES) IMPLANT
DERMABOND ADVANCED (GAUZE/BANDAGES/DRESSINGS) ×2
DERMABOND ADVANCED .7 DNX12 (GAUZE/BANDAGES/DRESSINGS) ×1 IMPLANT
DRAPE CHEST BREAST 15X10 FENES (DRAPES) ×3 IMPLANT
ELECT REM PT RETURN 9FT ADLT (ELECTROSURGICAL) ×3
ELECTRODE REM PT RTRN 9FT ADLT (ELECTROSURGICAL) ×1 IMPLANT
GAUZE 4X4 16PLY RFD (DISPOSABLE) ×6 IMPLANT
GLOVE SURG SIGNA 7.5 PF LTX (GLOVE) ×3 IMPLANT
GLOVE TRIUMPH SURG SIZE 7.5 (KITS) ×3 IMPLANT
GOWN STRL REUS W/ TWL LRG LVL3 (GOWN DISPOSABLE) ×1 IMPLANT
GOWN STRL REUS W/ TWL XL LVL3 (GOWN DISPOSABLE) ×1 IMPLANT
GOWN STRL REUS W/TWL LRG LVL3 (GOWN DISPOSABLE) ×2
GOWN STRL REUS W/TWL XL LVL3 (GOWN DISPOSABLE) ×2
HEMOSTAT SURGICEL 2X14 (HEMOSTASIS) ×6 IMPLANT
KIT BASIN OR (CUSTOM PROCEDURE TRAY) ×3 IMPLANT
KIT TURNOVER KIT B (KITS) ×3 IMPLANT
NS IRRIG 1000ML POUR BTL (IV SOLUTION) ×3 IMPLANT
PACK GENERAL/GYN (CUSTOM PROCEDURE TRAY) ×3 IMPLANT
PAD ARMBOARD 7.5X6 YLW CONV (MISCELLANEOUS) IMPLANT
PENCIL BUTTON HOLSTER BLD 10FT (ELECTRODE) ×3 IMPLANT
SPONGE INTESTINAL PEANUT (DISPOSABLE) ×3 IMPLANT
SUT SILK 2 0 (SUTURE)
SUT SILK 2-0 18XBRD TIE 12 (SUTURE) IMPLANT
SUT VIC AB 2-0 CT1 27 (SUTURE)
SUT VIC AB 2-0 CT1 TAPERPNT 27 (SUTURE) IMPLANT
SUT VIC AB 3-0 SH 18 (SUTURE) ×3 IMPLANT
SUT VIC AB 3-0 SH 27 (SUTURE) ×2
SUT VIC AB 3-0 SH 27X BRD (SUTURE) ×1 IMPLANT
SUT VICRYL 4-0 PS2 18IN ABS (SUTURE) ×3 IMPLANT
SYR 10ML LL (SYRINGE) ×3 IMPLANT
TOWEL GREEN STERILE (TOWEL DISPOSABLE) ×3 IMPLANT
TOWEL GREEN STERILE FF (TOWEL DISPOSABLE) ×3 IMPLANT
WATER STERILE IRR 1000ML POUR (IV SOLUTION) ×3 IMPLANT

## 2019-05-24 NOTE — Anesthesia Procedure Notes (Signed)
Procedure Name: Intubation Date/Time: 05/24/2019 7:49 AM Performed by: Trinna Post., CRNA Pre-anesthesia Checklist: Patient identified, Emergency Drugs available, Suction available and Patient being monitored Patient Re-evaluated:Patient Re-evaluated prior to induction Oxygen Delivery Method: Circle system utilized Preoxygenation: Pre-oxygenation with 100% oxygen Induction Type: IV induction Ventilation: Mask ventilation without difficulty Laryngoscope Size: Mac and 4 Grade View: Grade I Tube type: Oral Tube size: 8.0 mm Number of attempts: 1 Airway Equipment and Method: Stylet and Oral airway Placement Confirmation: ETT inserted through vocal cords under direct vision,  positive ETCO2 and breath sounds checked- equal and bilateral Secured at: 24 cm Tube secured with: Tape Dental Injury: Teeth and Oropharynx as per pre-operative assessment

## 2019-05-24 NOTE — Brief Op Note (Signed)
05/24/2019  9:26 AM  PATIENT:  Antonio Benson  63 y.o. male  PRE-OPERATIVE DIAGNOSIS:  MEDIASTINAL ADENOPATHY  POST-OPERATIVE DIAGNOSIS:  MEDIASTINAL ADENOPATHY  PROCEDURE:  Procedure(s): MEDIASTINOSCOPY (N/A)  SURGEON:  Surgeon(s) and Role:    * Melrose Nakayama, MD - Primary  PHYSICIAN ASSISTANT:   ASSISTANTS: none   ANESTHESIA:   general  EBL:  20 mL   BLOOD ADMINISTERED:none  DRAINS: none   LOCAL MEDICATIONS USED:  NONE  SPECIMEN:  Source of Specimen:  Mediastinal nodes  DISPOSITION OF SPECIMEN:  Path and micro  COUNTS:  YES  TOURNIQUET:  * No tourniquets in log *  DICTATION: .Other Dictation: Dictation Number -  PLAN OF CARE: Discharge to home after PACU  PATIENT DISPOSITION:  PACU - hemodynamically stable.   Delay start of Pharmacological VTE agent (>24hrs) due to surgical blood loss or risk of bleeding: not applicable

## 2019-05-24 NOTE — Discharge Instructions (Addendum)
Do not drive or engage in heavy physical activity for 48 hours  You may shower tomorrow  You have a medical adhesive over the incision. It will begin to peel off in 10-14 days  You have a prescription for oxycodone, a narcotic pain reliever. You may use as directed.  You may use acetaminophen (Tylenol) or ibuprofen (Advil, Motrin) in addition to, or instead of the oxycodone.  Do not drive within 4 hours of taking oxycodone  Call [phone] if you develop chest pain, shortness of breath, fever > 101 F or notice excessive pain, swelling, redness or drainage from the incision.  My office will contact you with a follow up appointment for next week to discuss results

## 2019-05-24 NOTE — Anesthesia Postprocedure Evaluation (Signed)
Anesthesia Post Note  Patient: Antonio Benson  Procedure(s) Performed: MEDIASTINOSCOPY (N/A )     Patient location during evaluation: PACU Anesthesia Type: General Level of consciousness: awake Pain management: pain level controlled Respiratory status: spontaneous breathing Cardiovascular status: stable Postop Assessment: no apparent nausea or vomiting Anesthetic complications: no    Last Vitals:  Vitals:   05/24/19 1005 05/24/19 1014  BP: 127/64 133/77  Pulse: 67 72  Resp: 19 16  Temp: (!) 36.2 C   SpO2: 94% 98%    Last Pain:  Vitals:   05/24/19 1005  TempSrc:   PainSc: 0-No pain                 Tatum Corl

## 2019-05-24 NOTE — Op Note (Signed)
NAME: Antonio Benson, Ellington P. MEDICAL RECORD RP:[id number] ACCOUNT [id number] DATE OF BIRTH:[birth year] FACILITY: [facility] LOCATION: [facility] PHYSICIAN:Elizabethann Lackey Chaya Jan, MD  OPERATIVE REPORT  DATE OF PROCEDURE:  05/24/2019  PREOPERATIVE DIAGNOSIS:  Mediastinal adenopathy.  POSTOPERATIVE DIAGNOSIS:  Mediastinal adenopathy.  PROCEDURE:  Mediastinoscopy.  SURGEON:  Modesto Charon, MD  ASSISTANT:  None.  ANESTHESIA:  General.  FINDINGS:  Enlarged, fleshy, vascular lymph nodes.  Frozen section- no carcinoma seen.  CLINICAL NOTE:  Mr. Antonio Benson is a 63 year old gentleman who was being evaluated for dizziness.  During this evaluation, he was noted to have anemia and hypercalcemia.  CT of the chest showed enlargement of the mediastinal lymph nodes.  He was referred for  biopsy with a differential of sarcoidosis versus lymphoma versus reactive lymphadenopathy.  The indications, risks, benefits and alternatives were discussed in detail with the patient.  He understood and accepted the risks and agreed to proceed.  OPERATIVE NOTE:  The patient was brought to the operating room on 05/24/2019.  He had induction of general anesthesia and was intubated.  Intravenous antibiotics were administered.  Sequential compression devices were placed on the calves for DVT  prophylaxis.  The neck and chest were prepped and draped in the usual sterile fashion.  A timeout was performed.  An incision was made 1 fingerbreadth above the sternal notch.  It was carried through the skin and subcutaneous tissue.  The strap muscles  were separated in the midline.  The pretracheal fascia was identified and incised and the pretracheal plane was developed bluntly into the mediastinum.  The mediastinoscope was inserted and systematic inspection of the mediastinal lymph node stations was  carried out.  The patient did tend to bleed relatively easily.  In the right paratracheal region, there was an enlarged, fleshy-appearing lymph  node, purplish in color.  Biopsies were taken and sent for AFB and fungal cultures, as well as  frozen  section.  Additional biopsies were sent for permanent pathology.  Inspection further down revealed an anterior paratracheal lymph node. Biopsies were taken from this node as well.  There was bleeding with the biopsies and cautery was used only  as necessary.  Surgicel was packed into each node bed after biopsies were performed.  Finally, the subcarinal space was approached and biopsies were obtained there also.  These were sent for permanent pathology.  The wound was packed with gauze and the  mediastinoscope was removed.  The frozen section returned showing no carcinoma.  There was no definitive diagnosis on the frozen section.  The gauze was removed.  The mediastinoscope was reinserted.  All the biopsy sites were reinspected.  There was no  significant ongoing bleeding.  The mediastinoscope was removed.  The incision was closed in 2 layers.  A subcuticular closure was used for the skin.  Dermabond was applied.  The patient was extubated in the operating room and taken to the postanesthetic  care unit in good condition.  VN/NUANCE  D:05/24/2019 T:05/24/2019 JOB:[id number]

## 2019-05-24 NOTE — Transfer of Care (Signed)
Immediate Anesthesia Transfer of Care Note  Patient: Antonio Benson  Procedure(s) Performed: MEDIASTINOSCOPY (N/A )  Patient Location: PACU  Anesthesia Type:General  Level of Consciousness: awake and alert   Airway & Oxygen Therapy: Patient connected to face mask oxygen  Post-op Assessment: Post -op Vital signs reviewed and stable  Post vital signs: Reviewed and stable  Last Vitals:  Vitals Value Taken Time  BP 130/73 05/24/19 0935  Temp    Pulse 72 05/24/19 0936  Resp 21 05/24/19 0936  SpO2 98 % 05/24/19 0936  Vitals shown include unvalidated device data.  Last Pain:  Vitals:   05/24/19 0635  TempSrc:   PainSc: 5       Patients Stated Pain Goal: 8 (75/79/72 8206)  Complications: No apparent anesthesia complications

## 2019-05-24 NOTE — Anesthesia Procedure Notes (Signed)
Arterial Line Insertion Start/End10/26/2020 7:10 AM Performed by: Trinna Post., CRNA, CRNA  Patient location: Pre-op. Preanesthetic checklist: patient identified, IV checked, site marked, risks and benefits discussed, surgical consent, monitors and equipment checked, pre-op evaluation, timeout performed and anesthesia consent Lidocaine 1% used for infiltration Right, radial was placed Catheter size: 20 G Hand hygiene performed  and maximum sterile barriers used   Attempts: 2 (Attempt x1 by SRNA, attempt x1 by CRNA) Procedure performed without using ultrasound guided technique. Following insertion, dressing applied and Biopatch. Post procedure assessment: normal  Patient tolerated the procedure well with no immediate complications.

## 2019-05-24 NOTE — Interval H&P Note (Signed)
History and Physical Interval Note:  05/24/2019 7:24 AM  Antonio Benson  has presented today for surgery, with the diagnosis of MEDIASTINAL ADENOPATHY.  The various methods of treatment have been discussed with the patient and family. After consideration of risks, benefits and other options for treatment, the patient has consented to  Procedure(s): MEDIASTINOSCOPY (N/A) as a surgical intervention.  The patient's history has been reviewed, patient examined, no change in status, stable for surgery.  I have reviewed the patient's chart and labs.  Questions were answered to the patient's satisfaction.     Melrose Nakayama

## 2019-05-25 ENCOUNTER — Encounter (HOSPITAL_COMMUNITY): Payer: Self-pay | Admitting: Thoracic Surgery (Cardiothoracic Vascular Surgery)

## 2019-05-25 LAB — ACID FAST SMEAR (AFB, MYCOBACTERIA): Acid Fast Smear: NEGATIVE

## 2019-05-27 LAB — AEROBIC CULTURE W GRAM STAIN (SUPERFICIAL SPECIMEN): Culture: NO GROWTH

## 2019-05-29 LAB — ANAEROBIC CULTURE

## 2019-05-31 LAB — SURGICAL PATHOLOGY

## 2019-06-01 ENCOUNTER — Other Ambulatory Visit: Payer: Self-pay

## 2019-06-01 ENCOUNTER — Ambulatory Visit (INDEPENDENT_AMBULATORY_CARE_PROVIDER_SITE_OTHER): Payer: Medicare Other | Admitting: Thoracic Surgery (Cardiothoracic Vascular Surgery)

## 2019-06-01 VITALS — BP 126/75 | HR 67 | Temp 97.7°F | Resp 20 | Ht 72.0 in | Wt 260.0 lb

## 2019-06-01 DIAGNOSIS — Z09 Encounter for follow-up examination after completed treatment for conditions other than malignant neoplasm: Secondary | ICD-10-CM | POA: Diagnosis not present

## 2019-06-01 DIAGNOSIS — C8332 Diffuse large B-cell lymphoma, intrathoracic lymph nodes: Secondary | ICD-10-CM

## 2019-06-01 DIAGNOSIS — R590 Localized enlarged lymph nodes: Secondary | ICD-10-CM | POA: Diagnosis not present

## 2019-06-01 NOTE — Progress Notes (Signed)
[address]       Antonio Benson,[place] [address]             [phone]       HPI: Antonio Benson returns for scheduled postoperative follow-up  Antonio Benson is a 63 year old man with a history of tobacco abuse, COPD, CAD, MI, hypertension, hyperlipidemia, obesity, and obstructive obstructive sleep apnea.  He was found to have some mild mediastinal adenopathy on a recent CT of the chest.  He was referred for mediastinoscopy.  We did that on 05/24/2019.  He went home the same day.  He did have a little bit of incisional pain.  He still has some pain pills left.  He also feels like his throat is "raw."  Past Medical History:  Diagnosis Date  . Anemia    low iron  . Arthritis   . COPD (chronic obstructive pulmonary disease) ([place])   . Coronary artery disease    1 stent  . GERD (gastroesophageal reflux disease)   . Headache   . Hyperlipidemia   . Hypertension   . Myocardial infarction ([place])    2015 or 2016  . Pneumonia   . Sinus trouble   . Sleep apnea    no longer using Cpap due to weight loss    Current Outpatient Medications  Medication Sig Dispense Refill  . albuterol (PROVENTIL HFA;VENTOLIN HFA) 108 (90 BASE) MCG/ACT inhaler Inhale 2 puffs into the lungs every 6 (six) hours as needed for wheezing or shortness of breath.    Marland Kitchen albuterol (PROVENTIL) (2.5 MG/3ML) 0.083% nebulizer solution Take 3 mLs (2.5 mg total) by nebulization every 6 (six) hours as needed for wheezing or shortness of breath. 120 mL 12  . aspirin 81 MG tablet Take 81 mg by mouth daily.    Marland Kitchen atorvastatin (LIPITOR) 80 MG tablet TAKE 1 TABLET BY MOUTH EVERY DAY (Patient taking differently: Take 80 mg by mouth at bedtime. ) 60 tablet 0  . carvedilol (COREG) 3.125 MG tablet TAKE 1 TABLET (3.125 MG TOTAL) BY MOUTH 2 (TWO) TIMES DAILY. 60 tablet 5  . ferrous sulfate 325 (65 FE) MG tablet Take 325 mg by mouth daily.    . Multiple Vitamin (MULTIVITAMIN WITH MINERALS) TABS tablet Take 1 tablet by mouth  daily.    . nitroGLYCERIN (NITROSTAT) 0.4 MG SL tablet PLACE 1 TABLET (0.4 MG TOTAL) UNDER THE TONGUE EVERY 5 (FIVE) MINUTES AS NEEDED FOR CHEST PAIN. 25 tablet 6  . oxyCODONE (OXY IR/ROXICODONE) 5 MG immediate release tablet Take 1-2 tablets (5-10 mg total) by mouth every 6 (six) hours as needed for moderate pain or severe pain. 30 tablet 0  . sodium chloride (OCEAN) 0.65 % SOLN nasal spray Place 1 spray into both nostrils as needed for congestion.    . TRELEGY ELLIPTA 100-62.5-25 MCG/INH AEPB Inhale 1 puff into the lungs daily.     No current facility-administered medications for this visit.     Physical Exam  Diagnostic Tests: Pathology FINAL MICROSCOPIC DIAGNOSIS:   A. LYMPH NODE, 4R, MEDIASTINAL, BIOPSY:  - Low grade non-Hodgkin B-cell lymphoma, see comment.   B. LYMPH NODE, LEVEL 4R, BIOPSY:  - Low grade non-Hodgkin B-cell lymphoma, see comment.   C. LYMPH NODE, LEVEL 7, BIOPSY:  - Low grade non-Hodgkin B-cell lymphoma, see comment.   Impression: Antonio Benson is a 63 year old gentleman with a past history of obesity, sleep apnea, hypertension, hyperlipidemia, MI, tobacco abuse, and COPD.  Recently a CT of the chest  abdomen pelvis showed some enlargement of his mediastinal lymph nodes.  I did mediastinoscopy last week.  Biopsies showed non-Hodgkin's B-cell lymphoma.  From a surgical standpoint he is doing well.  He does have a little bit of fluid under the incision.  That should resolve with time.  He is having minimal discomfort.  There are no restrictions on his activities.  He saw Eugenio Hoes a nurse practitioner at the [facility].  He will be seen by Dr. Bobby Rumpf.  We will communicate with their office to make sure he gets an appointment.  Plan: Follow-up with Dr. Bobby Rumpf at [facility]  Melrose Nakayama, MD [facility] [phone]

## 2019-06-03 ENCOUNTER — Other Ambulatory Visit: Payer: Medicare Other

## 2019-06-04 DIAGNOSIS — C884 Extranodal marginal zone B-cell lymphoma of mucosa-associated lymphoid tissue [MALT-lymphoma]: Secondary | ICD-10-CM | POA: Diagnosis not present

## 2019-06-04 DIAGNOSIS — D47Z9 Other specified neoplasms of uncertain behavior of lymphoid, hematopoietic and related tissue: Secondary | ICD-10-CM | POA: Diagnosis not present

## 2019-06-10 DIAGNOSIS — H903 Sensorineural hearing loss, bilateral: Secondary | ICD-10-CM | POA: Diagnosis not present

## 2019-06-18 LAB — CULTURE, FUNGUS WITHOUT SMEAR

## 2019-06-23 ENCOUNTER — Other Ambulatory Visit: Payer: Self-pay

## 2019-06-23 MED ORDER — ATORVASTATIN CALCIUM 80 MG PO TABS: 80.0000 mg | ORAL_TABLET | Freq: Every day | ORAL | 1 refills | Status: DC

## 2019-07-12 ENCOUNTER — Other Ambulatory Visit: Payer: Self-pay | Admitting: Cardiology

## 2019-07-16 LAB — ACID FAST CULTURE WITH REFLEXED SENSITIVITIES (MYCOBACTERIA): Acid Fast Culture: NEGATIVE

## 2019-07-26 ENCOUNTER — Other Ambulatory Visit: Payer: Self-pay

## 2019-07-26 ENCOUNTER — Ambulatory Visit (INDEPENDENT_AMBULATORY_CARE_PROVIDER_SITE_OTHER): Payer: Medicare Other

## 2019-07-26 DIAGNOSIS — I420 Dilated cardiomyopathy: Secondary | ICD-10-CM | POA: Diagnosis not present

## 2019-07-26 NOTE — Progress Notes (Unsigned)
Complete echocardiogram has been performed.  Jimmy Lubertha Leite RDCS, RVT 

## 2019-08-02 DIAGNOSIS — M9903 Segmental and somatic dysfunction of lumbar region: Secondary | ICD-10-CM | POA: Diagnosis not present

## 2019-08-02 DIAGNOSIS — M9904 Segmental and somatic dysfunction of sacral region: Secondary | ICD-10-CM | POA: Diagnosis not present

## 2019-08-02 DIAGNOSIS — M9902 Segmental and somatic dysfunction of thoracic region: Secondary | ICD-10-CM | POA: Diagnosis not present

## 2019-08-02 DIAGNOSIS — M5416 Radiculopathy, lumbar region: Secondary | ICD-10-CM | POA: Diagnosis not present

## 2019-08-02 DIAGNOSIS — M9905 Segmental and somatic dysfunction of pelvic region: Secondary | ICD-10-CM | POA: Diagnosis not present

## 2019-08-09 ENCOUNTER — Other Ambulatory Visit: Payer: Self-pay | Admitting: Cardiology

## 2019-08-16 DIAGNOSIS — Z7951 Long term (current) use of inhaled steroids: Secondary | ICD-10-CM | POA: Diagnosis not present

## 2019-09-06 DIAGNOSIS — C8590 Non-Hodgkin lymphoma, unspecified, unspecified site: Secondary | ICD-10-CM | POA: Diagnosis not present

## 2019-09-06 DIAGNOSIS — J449 Chronic obstructive pulmonary disease, unspecified: Secondary | ICD-10-CM | POA: Diagnosis not present

## 2019-09-06 DIAGNOSIS — F17210 Nicotine dependence, cigarettes, uncomplicated: Secondary | ICD-10-CM | POA: Diagnosis not present

## 2019-09-06 DIAGNOSIS — Z79891 Long term (current) use of opiate analgesic: Secondary | ICD-10-CM | POA: Diagnosis not present

## 2019-09-06 DIAGNOSIS — E669 Obesity, unspecified: Secondary | ICD-10-CM | POA: Diagnosis not present

## 2019-09-15 DIAGNOSIS — Z122 Encounter for screening for malignant neoplasm of respiratory organs: Secondary | ICD-10-CM | POA: Diagnosis not present

## 2019-09-20 DIAGNOSIS — E669 Obesity, unspecified: Secondary | ICD-10-CM | POA: Diagnosis not present

## 2019-09-20 DIAGNOSIS — R0602 Shortness of breath: Secondary | ICD-10-CM | POA: Diagnosis not present

## 2019-09-20 DIAGNOSIS — J449 Chronic obstructive pulmonary disease, unspecified: Secondary | ICD-10-CM | POA: Diagnosis not present

## 2019-09-20 DIAGNOSIS — R918 Other nonspecific abnormal finding of lung field: Secondary | ICD-10-CM | POA: Diagnosis not present

## 2019-09-28 ENCOUNTER — Ambulatory Visit (INDEPENDENT_AMBULATORY_CARE_PROVIDER_SITE_OTHER): Payer: Medicare Other | Admitting: Cardiology

## 2019-09-28 ENCOUNTER — Encounter: Payer: Self-pay | Admitting: Cardiology

## 2019-09-28 ENCOUNTER — Other Ambulatory Visit: Payer: Self-pay

## 2019-09-28 VITALS — BP 124/74 | HR 64 | Temp 96.9°F | Ht 72.0 in | Wt 248.0 lb

## 2019-09-28 DIAGNOSIS — C8510 Unspecified B-cell lymphoma, unspecified site: Secondary | ICD-10-CM | POA: Diagnosis not present

## 2019-09-28 DIAGNOSIS — I2510 Atherosclerotic heart disease of native coronary artery without angina pectoris: Secondary | ICD-10-CM

## 2019-09-28 DIAGNOSIS — F17200 Nicotine dependence, unspecified, uncomplicated: Secondary | ICD-10-CM | POA: Diagnosis not present

## 2019-09-28 DIAGNOSIS — I420 Dilated cardiomyopathy: Secondary | ICD-10-CM | POA: Diagnosis not present

## 2019-09-28 DIAGNOSIS — I10 Essential (primary) hypertension: Secondary | ICD-10-CM | POA: Diagnosis not present

## 2019-09-28 NOTE — Patient Instructions (Signed)
Medication Instructions:  Your physician recommends that you continue on your current medications as directed. Please refer to the Current Medication list given to you today.  *If you need a refill on your cardiac medications before your next appointment, please call your pharmacy*   Lab Work: Your physician recommends that you return for lab work today: lipids   If you have labs (blood work) drawn today and your tests are completely normal, you will receive your results only by: . MyChart Message (if you have MyChart) OR . A paper copy in the mail If you have any lab test that is abnormal or we need to change your treatment, we will call you to review the results.   Testing/Procedures: None.   Follow-Up: At [facility], you and your health needs are our priority.  As part of our continuing mission to provide you with exceptional heart care, we have created designated Provider Care Teams.  These Care Teams include your primary Cardiologist (physician) and Advanced Practice Providers (APPs -  Physician Assistants and Nurse Practitioners) who all work together to provide you with the care you need, when you need it.  We recommend signing up for the patient portal called "MyChart".  Sign up information is provided on this After Visit Summary.  MyChart is used to connect with patients for Virtual Visits (Telemedicine).  Patients are able to view lab/test results, encounter notes, upcoming appointments, etc.  Non-urgent messages can be sent to your provider as well.   To learn more about what you can do with MyChart, go to [email or web address].    Your next appointment:   6 month(s)  The format for your next appointment:   In Person  Provider:   Robert Krasowski, MD   Other Instructions    

## 2019-09-28 NOTE — Progress Notes (Signed)
Cardiology Office Note:    Date:  09/28/2019   ID:  Antonio Benson, DOB [birth year], MRN [id number]  PCP:  [name], MD  Cardiologist:  [name], MD    Referring MD: [name], MD   No chief complaint on file. Doing well  History of Present Illness:    Antonio Benson is a 64 y.o. male with complex past medical history.  That include coronary artery disease, status post stenting done many years ago, chronic smoking, COPD, dyslipidemia, history of cardiomyopathy with normalization.  Also recently he was recognized to have B-cell lymphoma.  He lost significant amount of weight.  Cardiac wise seems to be doing well he said that this year he feels. He did not have any pulmonary infection but he usually gets it he thinks that this is related to all protective measures that you use against coronavirus.  Denies have any chest pain tightness squeezing pressure burning chest.  Shortness of breath is as usual.  Sadly he still continues to smoke.  Past Medical History:  Diagnosis Date  . Anemia    low iron  . Arthritis   . COPD (chronic obstructive pulmonary disease) ([place])   . Coronary artery disease    1 stent  . GERD (gastroesophageal reflux disease)   . Headache   . Hyperlipidemia   . Hypertension   . Myocardial infarction ([place])    2015 or 2016  . Pneumonia   . Sinus trouble   . Sleep apnea    no longer using Cpap due to weight loss    Past Surgical History:  Procedure Laterality Date  . CARDIAC CATHETERIZATION    . CORONARY ANGIOPLASTY    . KNEE SURGERY Left    arthroscopy - meniscus tear  . MEDIASTINOSCOPY N/A 05/24/2019   Procedure: MEDIASTINOSCOPY;  Surgeon: Melrose Nakayama, MD;  Location: [facility] OR;  Service: Thoracic;  Laterality: N/A;  . NASAL SINUS SURGERY  1990's   deviated septum   . TONSILLECTOMY      Current Medications: Current Meds  Medication Sig  . albuterol (PROVENTIL HFA;VENTOLIN HFA) 108 (90 BASE) MCG/ACT inhaler  Inhale 2 puffs into the lungs every 6 (six) hours as needed for wheezing or shortness of breath.  Marland Kitchen albuterol (PROVENTIL) (2.5 MG/3ML) 0.083% nebulizer solution Take 3 mLs (2.5 mg total) by nebulization every 6 (six) hours as needed for wheezing or shortness of breath.  Marland Kitchen aspirin 81 MG tablet Take 81 mg by mouth daily.  Marland Kitchen atorvastatin (LIPITOR) 80 MG tablet Take 1 tablet (80 mg total) by mouth daily at 6 PM.  . carvedilol (COREG) 3.125 MG tablet TAKE 1 TABLET (3.125 MG TOTAL) BY MOUTH 2 (TWO) TIMES DAILY.  . ferrous sulfate 325 (65 FE) MG tablet Take 325 mg by mouth daily.  Marland Kitchen ipratropium-albuterol (DUONEB) 0.5-2.5 (3) MG/3ML SOLN SMARTSIG:1 Vial(s) Via Nebulizer Every 4-6 Hours PRN  . Multiple Vitamin (MULTIVITAMIN WITH MINERALS) TABS tablet Take 1 tablet by mouth daily.  . nitroGLYCERIN (NITROSTAT) 0.4 MG SL tablet PLACE 1 TABLET (0.4 MG TOTAL) UNDER THE TONGUE EVERY 5 (FIVE) MINUTES AS NEEDED FOR CHEST PAIN.  . sodium chloride (OCEAN) 0.65 % SOLN nasal spray Place 1 spray into both nostrils as needed for congestion.  . TRELEGY ELLIPTA 100-62.5-25 MCG/INH AEPB Inhale 1 puff into the lungs daily.     Allergies:   Patient has no known allergies.   Social History   Socioeconomic History  . Marital status: Married  Spouse name: Not on file  . Number of children: Not on file  . Years of education: Not on file  . Highest education level: Not on file  Occupational History  . Occupation: mail carrier  Tobacco Use  . Smoking status: Current Every Day Smoker    Packs/day: 1.00    Years: 40.00    Pack years: 40.00    Types: Cigarettes  . Smokeless tobacco: Never Used  Substance and Sexual Activity  . Alcohol use: No  . Drug use: No  . Sexual activity: Yes  Other Topics Concern  . Not on file  Social History Narrative  . Not on file   Social Determinants of Health   Financial Resource Strain:   . Difficulty of Paying Living Expenses: Not on file  Food Insecurity:   . Worried  About Charity fundraiser in the Last Year: Not on file  . Ran Out of Food in the Last Year: Not on file  Transportation Needs:   . Lack of Transportation (Medical): Not on file  . Lack of Transportation (Non-Medical): Not on file  Physical Activity:   . Days of Exercise per Week: Not on file  . Minutes of Exercise per Session: Not on file  Stress:   . Feeling of Stress : Not on file  Social Connections:   . Frequency of Communication with Friends and Family: Not on file  . Frequency of Social Gatherings with Friends and Family: Not on file  . Attends Religious Services: Not on file  . Active Member of Clubs or Organizations: Not on file  . Attends Archivist Meetings: Not on file  . Marital Status: Not on file     Family History: The patient's family history includes Aneurysm in his father; Asthma in his mother. ROS:   Please see the history of present illness.    All 14 point review of systems negative except as described per history of present illness  EKGs/Labs/Other Studies Reviewed:      Recent Labs: 05/20/2019: ALT 23; BUN 18; Creatinine, Ser 1.42; Hemoglobin 10.6; Platelets 164; Potassium 4.1; Sodium 140  Recent Lipid Panel    Component Value Date/Time   CHOL 88 (L) 07/20/2018 1038   TRIG 131 07/20/2018 1038   HDL 24 (L) 07/20/2018 1038   CHOLHDL 3.7 07/20/2018 1038   LDLCALC 38 07/20/2018 1038    Physical Exam:    VS:  BP 124/74   Pulse 64   Temp (!) 96.9 F (36.1 C)   Ht 6' (1.829 m)   Wt 248 lb (112.5 kg)   SpO2 99%   BMI 33.63 kg/m     Wt Readings from Last 3 Encounters:  09/28/19 248 lb (112.5 kg)  06/01/19 260 lb (117.9 kg)  05/24/19 260 lb (117.9 kg)     GEN:  Well nourished, well developed in no acute distress HEENT: Normal NECK: No JVD; No carotid bruits LYMPHATICS: No lymphadenopathy CARDIAC: RRR, no murmurs, no rubs, no gallops RESPIRATORY:  Clear to auscultation without rales, wheezing or rhonchi  ABDOMEN: Soft,  non-tender, non-distended MUSCULOSKELETAL:  No edema; No deformity  SKIN: Warm and dry LOWER EXTREMITIES: no swelling NEUROLOGIC:  Alert and oriented x 3 PSYCHIATRIC:  Normal affect   ASSESSMENT:    1. Coronary artery disease involving native coronary artery of native heart without angina pectoris   2. Essential hypertension   3. Dilated cardiomyopathy (HCC)   4. B-cell lymphoma, unspecified B-cell lymphoma type, unspecified body region ([place])  5. Smoking    PLAN:    In order of problems listed above:  1. Coronary disease doing well from that point review we will continue present management. 2. Essential hypertension blood pressure appears to be well controlled. 3. History of dilated cardiomyopathy.  I reviewed his echocardiogram which showed preserved left ventricle ejection fraction. 4. B-cell lymphoma followed by our oncology team. 5. Smoking still ongoing.  I spent discussions convincing him to quit smoking but he told me straight that most likely would not be able to do that 6. Dyslipidemia: We will check his fasting lipid profile today   Medication Adjustments/Labs and Tests Ordered: Current medicines are reviewed at length with the patient today.  Concerns regarding medicines are outlined above.  No orders of the defined types were placed in this encounter.  Medication changes: No orders of the defined types were placed in this encounter.   Signed, Park Liter, MD, [facility] 09/28/2019 3:22 PM    [place]

## 2019-09-29 ENCOUNTER — Encounter: Payer: Self-pay | Admitting: *Deleted

## 2019-09-29 LAB — LIPID PANEL
Chol/HDL Ratio: 4.3 ratio (ref 0.0–5.0)
Cholesterol, Total: 95 mg/dL — ABNORMAL LOW (ref 100–199)
HDL: 22 mg/dL — ABNORMAL LOW (ref >39)
LDL Chol Calc (NIH): 49 mg/dL (ref 0–99)
Triglycerides: 133 mg/dL (ref 0–149)
VLDL Cholesterol Cal: 24 mg/dL (ref 5–40)

## 2019-10-01 DIAGNOSIS — C884 Extranodal marginal zone B-cell lymphoma of mucosa-associated lymphoid tissue [MALT-lymphoma]: Secondary | ICD-10-CM | POA: Diagnosis not present

## 2019-10-01 DIAGNOSIS — C8582 Other specified types of non-Hodgkin lymphoma, intrathoracic lymph nodes: Secondary | ICD-10-CM | POA: Diagnosis not present

## 2019-10-22 ENCOUNTER — Other Ambulatory Visit: Payer: Self-pay | Admitting: Cardiology

## 2019-12-02 DIAGNOSIS — J329 Chronic sinusitis, unspecified: Secondary | ICD-10-CM | POA: Diagnosis not present

## 2019-12-02 DIAGNOSIS — J40 Bronchitis, not specified as acute or chronic: Secondary | ICD-10-CM | POA: Diagnosis not present

## 2019-12-02 DIAGNOSIS — F17200 Nicotine dependence, unspecified, uncomplicated: Secondary | ICD-10-CM | POA: Diagnosis not present

## 2019-12-02 DIAGNOSIS — I509 Heart failure, unspecified: Secondary | ICD-10-CM | POA: Diagnosis not present

## 2019-12-13 DIAGNOSIS — F17200 Nicotine dependence, unspecified, uncomplicated: Secondary | ICD-10-CM | POA: Diagnosis not present

## 2019-12-13 DIAGNOSIS — J441 Chronic obstructive pulmonary disease with (acute) exacerbation: Secondary | ICD-10-CM | POA: Diagnosis not present

## 2019-12-17 ENCOUNTER — Other Ambulatory Visit: Payer: Self-pay | Admitting: Cardiology

## 2020-01-17 ENCOUNTER — Other Ambulatory Visit: Payer: Self-pay | Admitting: Cardiology

## 2020-02-01 DIAGNOSIS — C884 Extranodal marginal zone B-cell lymphoma of mucosa-associated lymphoid tissue [MALT-lymphoma]: Secondary | ICD-10-CM | POA: Diagnosis not present

## 2020-02-09 ENCOUNTER — Other Ambulatory Visit: Payer: Self-pay

## 2020-02-09 ENCOUNTER — Other Ambulatory Visit (HOSPITAL_COMMUNITY)
Admission: RE | Admit: 2020-02-09 | Discharge: 2020-02-09 | Disposition: A | Discharge status: Home / Self Care | Payer: Medicare Other | Source: Ambulatory Visit | Attending: Oncology | Admitting: Oncology

## 2020-02-09 DIAGNOSIS — D61818 Other pancytopenia: Secondary | ICD-10-CM | POA: Diagnosis not present

## 2020-02-09 DIAGNOSIS — C8592 Non-Hodgkin lymphoma, unspecified, intrathoracic lymph nodes: Secondary | ICD-10-CM | POA: Diagnosis not present

## 2020-02-09 DIAGNOSIS — Z0001 Encounter for general adult medical examination with abnormal findings: Secondary | ICD-10-CM | POA: Diagnosis not present

## 2020-02-09 DIAGNOSIS — C8590 Non-Hodgkin lymphoma, unspecified, unspecified site: Secondary | ICD-10-CM | POA: Diagnosis not present

## 2020-02-11 LAB — SURGICAL PATHOLOGY

## 2020-02-14 LAB — SURGICAL PATHOLOGY

## 2020-02-16 ENCOUNTER — Encounter (HOSPITAL_COMMUNITY): Payer: Self-pay | Admitting: Oncology

## 2020-02-16 DIAGNOSIS — C8382 Other non-follicular lymphoma, intrathoracic lymph nodes: Secondary | ICD-10-CM | POA: Diagnosis not present

## 2020-02-17 DIAGNOSIS — J90 Pleural effusion, not elsewhere classified: Secondary | ICD-10-CM | POA: Diagnosis not present

## 2020-02-17 DIAGNOSIS — C8590 Non-Hodgkin lymphoma, unspecified, unspecified site: Secondary | ICD-10-CM | POA: Diagnosis not present

## 2020-02-22 ENCOUNTER — Other Ambulatory Visit (HOSPITAL_COMMUNITY)
Admission: RE | Admit: 2020-02-22 | Discharge: 2020-02-22 | Disposition: A | Discharge status: Home / Self Care | Payer: Medicare Other | Source: Ambulatory Visit | Attending: Oncology | Admitting: Oncology

## 2020-02-22 DIAGNOSIS — J90 Pleural effusion, not elsewhere classified: Secondary | ICD-10-CM | POA: Diagnosis not present

## 2020-02-22 DIAGNOSIS — J8410 Pulmonary fibrosis, unspecified: Secondary | ICD-10-CM | POA: Diagnosis not present

## 2020-02-22 DIAGNOSIS — R918 Other nonspecific abnormal finding of lung field: Secondary | ICD-10-CM | POA: Diagnosis not present

## 2020-02-22 DIAGNOSIS — J449 Chronic obstructive pulmonary disease, unspecified: Secondary | ICD-10-CM | POA: Diagnosis present

## 2020-02-22 DIAGNOSIS — E669 Obesity, unspecified: Secondary | ICD-10-CM | POA: Diagnosis present

## 2020-02-22 DIAGNOSIS — F17210 Nicotine dependence, cigarettes, uncomplicated: Secondary | ICD-10-CM | POA: Diagnosis present

## 2020-02-22 DIAGNOSIS — E876 Hypokalemia: Secondary | ICD-10-CM | POA: Diagnosis not present

## 2020-02-22 DIAGNOSIS — E559 Vitamin D deficiency, unspecified: Secondary | ICD-10-CM | POA: Diagnosis present

## 2020-02-22 DIAGNOSIS — I700 Atherosclerosis of aorta: Secondary | ICD-10-CM | POA: Diagnosis not present

## 2020-02-22 DIAGNOSIS — J9811 Atelectasis: Secondary | ICD-10-CM | POA: Diagnosis not present

## 2020-02-22 DIAGNOSIS — R091 Pleurisy: Secondary | ICD-10-CM | POA: Diagnosis not present

## 2020-02-22 DIAGNOSIS — J441 Chronic obstructive pulmonary disease with (acute) exacerbation: Secondary | ICD-10-CM | POA: Diagnosis not present

## 2020-02-22 DIAGNOSIS — R161 Splenomegaly, not elsewhere classified: Secondary | ICD-10-CM | POA: Diagnosis not present

## 2020-02-22 DIAGNOSIS — R0600 Dyspnea, unspecified: Secondary | ICD-10-CM | POA: Diagnosis not present

## 2020-02-22 DIAGNOSIS — Z4682 Encounter for fitting and adjustment of non-vascular catheter: Secondary | ICD-10-CM | POA: Diagnosis not present

## 2020-02-22 DIAGNOSIS — I252 Old myocardial infarction: Secondary | ICD-10-CM | POA: Diagnosis not present

## 2020-02-22 DIAGNOSIS — C8590 Non-Hodgkin lymphoma, unspecified, unspecified site: Secondary | ICD-10-CM | POA: Diagnosis not present

## 2020-02-22 DIAGNOSIS — D649 Anemia, unspecified: Secondary | ICD-10-CM | POA: Diagnosis not present

## 2020-02-22 DIAGNOSIS — C8593 Non-Hodgkin lymphoma, unspecified, intra-abdominal lymph nodes: Secondary | ICD-10-CM | POA: Diagnosis present

## 2020-02-22 DIAGNOSIS — E785 Hyperlipidemia, unspecified: Secondary | ICD-10-CM | POA: Diagnosis present

## 2020-02-22 DIAGNOSIS — J189 Pneumonia, unspecified organism: Secondary | ICD-10-CM | POA: Diagnosis not present

## 2020-02-22 DIAGNOSIS — I2510 Atherosclerotic heart disease of native coronary artery without angina pectoris: Secondary | ICD-10-CM | POA: Diagnosis not present

## 2020-02-22 DIAGNOSIS — J180 Bronchopneumonia, unspecified organism: Secondary | ICD-10-CM | POA: Diagnosis not present

## 2020-02-22 DIAGNOSIS — J9601 Acute respiratory failure with hypoxia: Secondary | ICD-10-CM | POA: Diagnosis not present

## 2020-02-22 DIAGNOSIS — R0602 Shortness of breath: Secondary | ICD-10-CM | POA: Diagnosis not present

## 2020-02-22 DIAGNOSIS — Z79899 Other long term (current) drug therapy: Secondary | ICD-10-CM | POA: Diagnosis not present

## 2020-02-22 DIAGNOSIS — D61818 Other pancytopenia: Secondary | ICD-10-CM | POA: Diagnosis present

## 2020-02-22 DIAGNOSIS — J939 Pneumothorax, unspecified: Secondary | ICD-10-CM | POA: Diagnosis not present

## 2020-02-22 DIAGNOSIS — Z955 Presence of coronary angioplasty implant and graft: Secondary | ICD-10-CM | POA: Diagnosis not present

## 2020-02-22 DIAGNOSIS — Z7982 Long term (current) use of aspirin: Secondary | ICD-10-CM | POA: Diagnosis not present

## 2020-02-22 DIAGNOSIS — J95811 Postprocedural pneumothorax: Secondary | ICD-10-CM | POA: Diagnosis present

## 2020-02-22 DIAGNOSIS — R222 Localized swelling, mass and lump, trunk: Secondary | ICD-10-CM | POA: Diagnosis not present

## 2020-02-22 DIAGNOSIS — G4733 Obstructive sleep apnea (adult) (pediatric): Secondary | ICD-10-CM | POA: Diagnosis present

## 2020-02-22 DIAGNOSIS — D638 Anemia in other chronic diseases classified elsewhere: Secondary | ICD-10-CM | POA: Diagnosis present

## 2020-02-22 DIAGNOSIS — I110 Hypertensive heart disease with heart failure: Secondary | ICD-10-CM | POA: Diagnosis present

## 2020-02-22 DIAGNOSIS — Z6832 Body mass index (BMI) 32.0-32.9, adult: Secondary | ICD-10-CM | POA: Diagnosis not present

## 2020-02-22 DIAGNOSIS — I5031 Acute diastolic (congestive) heart failure: Secondary | ICD-10-CM | POA: Diagnosis present

## 2020-02-22 DIAGNOSIS — J918 Pleural effusion in other conditions classified elsewhere: Secondary | ICD-10-CM | POA: Diagnosis not present

## 2020-02-22 DIAGNOSIS — C8510 Unspecified B-cell lymphoma, unspecified site: Secondary | ICD-10-CM | POA: Diagnosis not present

## 2020-02-22 DIAGNOSIS — J948 Other specified pleural conditions: Secondary | ICD-10-CM | POA: Diagnosis not present

## 2020-02-23 DIAGNOSIS — J90 Pleural effusion, not elsewhere classified: Secondary | ICD-10-CM | POA: Insufficient documentation

## 2020-02-25 LAB — SURGICAL PATHOLOGY

## 2020-02-28 DIAGNOSIS — C8590 Non-Hodgkin lymphoma, unspecified, unspecified site: Secondary | ICD-10-CM | POA: Diagnosis not present

## 2020-02-28 DIAGNOSIS — R7301 Impaired fasting glucose: Secondary | ICD-10-CM | POA: Diagnosis not present

## 2020-03-01 DIAGNOSIS — C8382 Other non-follicular lymphoma, intrathoracic lymph nodes: Secondary | ICD-10-CM | POA: Diagnosis not present

## 2020-03-03 DIAGNOSIS — E669 Obesity, unspecified: Secondary | ICD-10-CM | POA: Diagnosis not present

## 2020-03-03 DIAGNOSIS — J90 Pleural effusion, not elsewhere classified: Secondary | ICD-10-CM | POA: Diagnosis not present

## 2020-03-03 DIAGNOSIS — J449 Chronic obstructive pulmonary disease, unspecified: Secondary | ICD-10-CM | POA: Diagnosis not present

## 2020-03-06 ENCOUNTER — Other Ambulatory Visit (HOSPITAL_COMMUNITY)
Admission: RE | Admit: 2020-03-06 | Discharge: 2020-03-06 | Disposition: A | Discharge status: Home / Self Care | Payer: Medicare Other | Source: Ambulatory Visit | Attending: Oncology | Admitting: Oncology

## 2020-03-06 ENCOUNTER — Other Ambulatory Visit: Payer: Self-pay

## 2020-03-06 DIAGNOSIS — D72819 Decreased white blood cell count, unspecified: Secondary | ICD-10-CM | POA: Diagnosis not present

## 2020-03-06 DIAGNOSIS — C8382 Other non-follicular lymphoma, intrathoracic lymph nodes: Secondary | ICD-10-CM | POA: Diagnosis not present

## 2020-03-06 DIAGNOSIS — Z79899 Other long term (current) drug therapy: Secondary | ICD-10-CM | POA: Diagnosis not present

## 2020-03-06 DIAGNOSIS — D61818 Other pancytopenia: Secondary | ICD-10-CM | POA: Diagnosis not present

## 2020-03-06 DIAGNOSIS — C8590 Non-Hodgkin lymphoma, unspecified, unspecified site: Secondary | ICD-10-CM | POA: Diagnosis not present

## 2020-03-08 LAB — SURGICAL PATHOLOGY

## 2020-03-13 ENCOUNTER — Encounter (HOSPITAL_COMMUNITY): Payer: Self-pay | Admitting: Oncology

## 2020-03-15 DIAGNOSIS — C8382 Other non-follicular lymphoma, intrathoracic lymph nodes: Secondary | ICD-10-CM | POA: Diagnosis not present

## 2020-03-16 ENCOUNTER — Telehealth: Payer: Self-pay | Admitting: Emergency Medicine

## 2020-03-16 DIAGNOSIS — J418 Mixed simple and mucopurulent chronic bronchitis: Secondary | ICD-10-CM | POA: Diagnosis not present

## 2020-03-16 DIAGNOSIS — C8382 Other non-follicular lymphoma, intrathoracic lymph nodes: Secondary | ICD-10-CM | POA: Insufficient documentation

## 2020-03-16 NOTE — Telephone Encounter (Signed)
   [facility] Pre-operative Risk Assessment    HEARTCARE STAFF: - Please ensure there is not already an duplicate clearance open for this procedure. - Under Visit Info/Reason for Call, type in Other and utilize the format Clearance MM/DD/YY or Clearance TBD. Do not use dashes or single digits. - If request is for dental extraction, please clarify the # of teeth to be extracted.  Request for surgical clearance:  1. What type of surgery is being performed? Port-a-cath insertion   2. When is this surgery scheduled? 03/24/20  What type of clearance is required (medical clearance vs. Pharmacy clearance to hold med vs. Both)? Medical   3. Are there any medications that need to be held prior to surgery and how long none specified   Practice name and name of physician performing surgery? [facility] [place]   4. What is the office phone number? [phone]   7.   What is the office fax number? [phone]  8.   Anesthesia type (None, local, MAC, general) ? None specified    Antonio Benson 03/16/2020, 4:17 PM  _________________________________________________________________   (provider comments below)

## 2020-03-20 DIAGNOSIS — C8382 Other non-follicular lymphoma, intrathoracic lymph nodes: Secondary | ICD-10-CM | POA: Diagnosis not present

## 2020-03-20 DIAGNOSIS — Z20822 Contact with and (suspected) exposure to covid-19: Secondary | ICD-10-CM | POA: Diagnosis not present

## 2020-03-20 DIAGNOSIS — J418 Mixed simple and mucopurulent chronic bronchitis: Secondary | ICD-10-CM | POA: Diagnosis not present

## 2020-03-20 DIAGNOSIS — F17210 Nicotine dependence, cigarettes, uncomplicated: Secondary | ICD-10-CM | POA: Diagnosis not present

## 2020-03-20 NOTE — Telephone Encounter (Signed)
Patient's wife returning call. 

## 2020-03-20 NOTE — Telephone Encounter (Signed)
Left message to call back.  Kerin Ransom PA-C 03/20/2020 8:50 AM

## 2020-03-20 NOTE — Telephone Encounter (Signed)
   Primary Cardiologist: Dr Drue Dun  Chart reviewed and I spoke with the patient's wife today on the phone as part of pre-operative protocol coverage. Given past medical history and time since last visit, based on ACC/AHA guidelines, Antonio Benson would be at acceptable risk for the planned procedure without further cardiovascular testing.   OK to hold aspirin 5 days pre op if needed (they have already held this).   I will route this recommendation to the requesting party via Epic fax function and remove from pre-op pool.  Please call with questions.  Kerin Ransom, PA-C 03/20/2020, 1:36 PM

## 2020-03-22 DIAGNOSIS — C8382 Other non-follicular lymphoma, intrathoracic lymph nodes: Secondary | ICD-10-CM | POA: Diagnosis not present

## 2020-03-22 DIAGNOSIS — Z5111 Encounter for antineoplastic chemotherapy: Secondary | ICD-10-CM | POA: Diagnosis not present

## 2020-03-23 DIAGNOSIS — Z5111 Encounter for antineoplastic chemotherapy: Secondary | ICD-10-CM | POA: Diagnosis not present

## 2020-03-23 DIAGNOSIS — C8382 Other non-follicular lymphoma, intrathoracic lymph nodes: Secondary | ICD-10-CM | POA: Diagnosis not present

## 2020-03-24 DIAGNOSIS — Z452 Encounter for adjustment and management of vascular access device: Secondary | ICD-10-CM | POA: Diagnosis not present

## 2020-03-24 DIAGNOSIS — F17210 Nicotine dependence, cigarettes, uncomplicated: Secondary | ICD-10-CM | POA: Diagnosis not present

## 2020-03-24 DIAGNOSIS — I10 Essential (primary) hypertension: Secondary | ICD-10-CM | POA: Diagnosis not present

## 2020-03-24 DIAGNOSIS — C8382 Other non-follicular lymphoma, intrathoracic lymph nodes: Secondary | ICD-10-CM | POA: Diagnosis not present

## 2020-03-24 DIAGNOSIS — F17200 Nicotine dependence, unspecified, uncomplicated: Secondary | ICD-10-CM | POA: Diagnosis not present

## 2020-03-24 DIAGNOSIS — C8310 Mantle cell lymphoma, unspecified site: Secondary | ICD-10-CM | POA: Diagnosis not present

## 2020-03-24 DIAGNOSIS — G4733 Obstructive sleep apnea (adult) (pediatric): Secondary | ICD-10-CM | POA: Diagnosis not present

## 2020-03-24 DIAGNOSIS — J449 Chronic obstructive pulmonary disease, unspecified: Secondary | ICD-10-CM | POA: Diagnosis not present

## 2020-03-24 DIAGNOSIS — Z955 Presence of coronary angioplasty implant and graft: Secondary | ICD-10-CM | POA: Diagnosis not present

## 2020-03-24 DIAGNOSIS — Z9989 Dependence on other enabling machines and devices: Secondary | ICD-10-CM | POA: Diagnosis not present

## 2020-03-24 DIAGNOSIS — I252 Old myocardial infarction: Secondary | ICD-10-CM | POA: Diagnosis not present

## 2020-03-24 DIAGNOSIS — E785 Hyperlipidemia, unspecified: Secondary | ICD-10-CM | POA: Diagnosis not present

## 2020-03-24 DIAGNOSIS — C8312 Mantle cell lymphoma, intrathoracic lymph nodes: Secondary | ICD-10-CM | POA: Diagnosis not present

## 2020-03-24 DIAGNOSIS — Z79899 Other long term (current) drug therapy: Secondary | ICD-10-CM | POA: Diagnosis not present

## 2020-03-29 DIAGNOSIS — C8382 Other non-follicular lymphoma, intrathoracic lymph nodes: Secondary | ICD-10-CM | POA: Diagnosis not present

## 2020-03-29 DIAGNOSIS — R509 Fever, unspecified: Secondary | ICD-10-CM | POA: Diagnosis not present

## 2020-03-29 DIAGNOSIS — M5134 Other intervertebral disc degeneration, thoracic region: Secondary | ICD-10-CM | POA: Diagnosis not present

## 2020-03-30 DIAGNOSIS — D649 Anemia, unspecified: Secondary | ICD-10-CM | POA: Diagnosis not present

## 2020-03-30 DIAGNOSIS — C8382 Other non-follicular lymphoma, intrathoracic lymph nodes: Secondary | ICD-10-CM | POA: Diagnosis not present

## 2020-04-05 DIAGNOSIS — Z09 Encounter for follow-up examination after completed treatment for conditions other than malignant neoplasm: Secondary | ICD-10-CM | POA: Insufficient documentation

## 2020-04-18 DIAGNOSIS — R918 Other nonspecific abnormal finding of lung field: Secondary | ICD-10-CM | POA: Diagnosis not present

## 2020-04-18 DIAGNOSIS — J90 Pleural effusion, not elsewhere classified: Secondary | ICD-10-CM | POA: Diagnosis not present

## 2020-04-18 DIAGNOSIS — J449 Chronic obstructive pulmonary disease, unspecified: Secondary | ICD-10-CM | POA: Diagnosis not present

## 2020-04-18 DIAGNOSIS — R0602 Shortness of breath: Secondary | ICD-10-CM | POA: Diagnosis not present

## 2020-04-20 DIAGNOSIS — C8382 Other non-follicular lymphoma, intrathoracic lymph nodes: Secondary | ICD-10-CM | POA: Diagnosis not present

## 2020-04-21 DIAGNOSIS — C8382 Other non-follicular lymphoma, intrathoracic lymph nodes: Secondary | ICD-10-CM | POA: Diagnosis not present

## 2020-04-21 DIAGNOSIS — Z5111 Encounter for antineoplastic chemotherapy: Secondary | ICD-10-CM | POA: Diagnosis not present

## 2020-04-24 DIAGNOSIS — C8382 Other non-follicular lymphoma, intrathoracic lymph nodes: Secondary | ICD-10-CM | POA: Diagnosis not present

## 2020-04-24 DIAGNOSIS — Z5189 Encounter for other specified aftercare: Secondary | ICD-10-CM | POA: Diagnosis not present

## 2020-05-11 ENCOUNTER — Encounter: Payer: Self-pay | Admitting: Pharmacist

## 2020-05-11 DIAGNOSIS — J449 Chronic obstructive pulmonary disease, unspecified: Secondary | ICD-10-CM | POA: Diagnosis not present

## 2020-05-11 DIAGNOSIS — J069 Acute upper respiratory infection, unspecified: Secondary | ICD-10-CM | POA: Diagnosis not present

## 2020-05-11 DIAGNOSIS — C8382 Other non-follicular lymphoma, intrathoracic lymph nodes: Secondary | ICD-10-CM

## 2020-05-11 DIAGNOSIS — J189 Pneumonia, unspecified organism: Secondary | ICD-10-CM | POA: Diagnosis not present

## 2020-05-15 DIAGNOSIS — J189 Pneumonia, unspecified organism: Secondary | ICD-10-CM | POA: Diagnosis not present

## 2020-05-16 ENCOUNTER — Other Ambulatory Visit: Payer: Self-pay | Admitting: Hematology and Oncology

## 2020-05-16 DIAGNOSIS — C8382 Other non-follicular lymphoma, intrathoracic lymph nodes: Secondary | ICD-10-CM | POA: Diagnosis not present

## 2020-05-16 DIAGNOSIS — C8332 Diffuse large B-cell lymphoma, intrathoracic lymph nodes: Secondary | ICD-10-CM | POA: Diagnosis not present

## 2020-05-16 LAB — BASIC METABOLIC PANEL
BUN: 11 (ref 4–21)
CO2: 27 — AB (ref 13–22)
Chloride: 107 (ref 99–108)
Creatinine: 0.9 (ref 0.6–1.3)
Glucose: 91
Potassium: 3.5 (ref 3.4–5.3)
Sodium: 141 (ref 137–147)

## 2020-05-16 LAB — HEPATIC FUNCTION PANEL
ALT: 11 (ref 10–40)
AST: 17 (ref 14–40)
Alkaline Phosphatase: 76 (ref 25–125)
Bilirubin, Total: 0.6

## 2020-05-16 LAB — COMPREHENSIVE METABOLIC PANEL
Albumin: 2.8 — AB (ref 3.5–5.0)
Calcium: 8.6 — AB (ref 8.7–10.7)

## 2020-05-16 LAB — CORRECTED CALCIUM (CC13): Calcium, Corrected: 9.8

## 2020-05-16 LAB — CBC AND DIFFERENTIAL
HCT: 28 — AB (ref 41–53)
Hemoglobin: 9.1 — AB (ref 13.5–17.5)
Neutrophils Absolute: 0.82
Platelets: 258 (ref 150–399)
WBC: 2

## 2020-05-16 LAB — CBC: RBC: 3.11 — AB (ref 3.87–5.11)

## 2020-05-17 ENCOUNTER — Telehealth: Payer: Self-pay | Admitting: Oncology

## 2020-05-17 ENCOUNTER — Inpatient Hospital Stay: Payer: Medicare Other | Attending: Oncology

## 2020-05-17 ENCOUNTER — Other Ambulatory Visit: Payer: Self-pay

## 2020-05-17 VITALS — BP 125/72 | HR 76 | Temp 98.2°F | Resp 20 | Ht 72.0 in | Wt 207.8 lb

## 2020-05-17 DIAGNOSIS — Z5189 Encounter for other specified aftercare: Secondary | ICD-10-CM | POA: Insufficient documentation

## 2020-05-17 DIAGNOSIS — C8382 Other non-follicular lymphoma, intrathoracic lymph nodes: Secondary | ICD-10-CM

## 2020-05-17 DIAGNOSIS — C8302 Small cell B-cell lymphoma, intrathoracic lymph nodes: Secondary | ICD-10-CM | POA: Diagnosis not present

## 2020-05-17 DIAGNOSIS — Z5111 Encounter for antineoplastic chemotherapy: Secondary | ICD-10-CM | POA: Insufficient documentation

## 2020-05-17 DIAGNOSIS — Z5112 Encounter for antineoplastic immunotherapy: Secondary | ICD-10-CM | POA: Insufficient documentation

## 2020-05-17 MED ORDER — HEPARIN SOD (PORK) LOCK FLUSH 100 UNIT/ML IV SOLN
500.0000 [IU] | Freq: Once | INTRAVENOUS | Status: AC | PRN
  Administered 2020-05-17: 500 [IU]
  Filled 2020-05-17: qty 5

## 2020-05-17 MED ORDER — SODIUM CHLORIDE 0.9 % IV SOLN
90.0000 mg/m2 | Freq: Once | INTRAVENOUS | Status: AC
  Administered 2020-05-17: 200 mg via INTRAVENOUS
  Filled 2020-05-17: qty 8

## 2020-05-17 MED ORDER — DIPHENHYDRAMINE HCL 50 MG/ML IJ SOLN
INTRAMUSCULAR | Status: AC
  Filled 2020-05-17: qty 1

## 2020-05-17 MED ORDER — SODIUM CHLORIDE 0.9 % IV SOLN
10.0000 mg | Freq: Once | INTRAVENOUS | Status: AC
  Administered 2020-05-17: 10 mg via INTRAVENOUS
  Filled 2020-05-17: qty 10

## 2020-05-17 MED ORDER — SODIUM CHLORIDE 0.9% FLUSH
10.0000 mL | INTRAVENOUS | Status: DC | PRN
  Administered 2020-05-17: 10 mL
  Filled 2020-05-17: qty 10

## 2020-05-17 MED ORDER — ACETAMINOPHEN 325 MG PO TABS
ORAL_TABLET | ORAL | Status: AC
  Filled 2020-05-17: qty 2

## 2020-05-17 MED ORDER — DIPHENHYDRAMINE HCL 50 MG/ML IJ SOLN
25.0000 mg | Freq: Once | INTRAMUSCULAR | Status: AC
  Administered 2020-05-17: 25 mg via INTRAVENOUS

## 2020-05-17 MED ORDER — PALONOSETRON HCL INJECTION 0.25 MG/5ML
0.2500 mg | Freq: Once | INTRAVENOUS | Status: AC
  Administered 2020-05-17: 0.25 mg via INTRAVENOUS

## 2020-05-17 MED ORDER — SODIUM CHLORIDE 0.9 % IV SOLN
Freq: Once | INTRAVENOUS | Status: AC
  Filled 2020-05-17: qty 250

## 2020-05-17 MED ORDER — SODIUM CHLORIDE 0.9 % IV SOLN
375.0000 mg/m2 | Freq: Once | INTRAVENOUS | Status: AC
  Administered 2020-05-17: 800 mg via INTRAVENOUS
  Filled 2020-05-17: qty 50

## 2020-05-17 MED ORDER — ACETAMINOPHEN 325 MG PO TABS
650.0000 mg | ORAL_TABLET | Freq: Once | ORAL | Status: AC
  Administered 2020-05-17: 650 mg via ORAL

## 2020-05-17 MED ORDER — PALONOSETRON HCL INJECTION 0.25 MG/5ML
INTRAVENOUS | Status: AC
  Filled 2020-05-17: qty 5

## 2020-05-17 NOTE — Patient Instructions (Signed)
Bendamustine Injection What is this medicine? BENDAMUSTINE (BEN da MUS teen) is a chemotherapy drug. It is used to treat chronic lymphocytic leukemia and non-Hodgkin lymphoma. This medicine may be used for other purposes; ask your health care provider or pharmacist if you have questions. COMMON BRAND NAME(S): Kristine Royal, Treanda What should I tell my health care provider before I take this medicine? They need to know if you have any of these conditions:  infection (especially a virus infection such as chickenpox, cold sores, or herpes)  kidney disease  liver disease  an unusual or allergic reaction to bendamustine, mannitol, other medicines, foods, dyes, or preservatives  pregnant or trying to get pregnant  breast-feeding How should I use this medicine? This medicine is for infusion into a vein. It is given by a health care professional in a hospital or clinic setting. Talk to your pediatrician regarding the use of this medicine in children. Special care may be needed. Overdosage: If you think you have taken too much of this medicine contact a poison control center or emergency room at once. NOTE: This medicine is only for you. Do not share this medicine with others. What if I miss a dose? It is important not to miss your dose. Call your doctor or health care professional if you are unable to keep an appointment. What may interact with this medicine? Do not take this medicine with any of the following medications:  clozapine This medicine may also interact with the following medications:  atazanavir  cimetidine  ciprofloxacin  enoxacin  fluvoxamine  medicines for seizures like carbamazepine and phenobarbital  mexiletine  rifampin  tacrine  thiabendazole  zileuton This list may not describe all possible interactions. Give your health care provider a list of all the medicines, herbs, non-prescription drugs, or dietary supplements you use. Also tell them if  you smoke, drink alcohol, or use illegal drugs. Some items may interact with your medicine. What should I watch for while using this medicine? This drug may make you feel generally unwell. This is not uncommon, as chemotherapy can affect healthy cells as well as cancer cells. Report any side effects. Continue your course of treatment even though you feel ill unless your doctor tells you to stop. You may need blood work done while you are taking this medicine. Call your doctor or healthcare provider for advice if you get a fever, chills or sore throat, or other symptoms of a cold or flu. Do not treat yourself. This drug decreases your body's ability to fight infections. Try to avoid being around people who are sick. This medicine may cause serious skin reactions. They can happen weeks to months after starting the medicine. Contact your healthcare provider right away if you notice fevers or flu-like symptoms with a rash. The rash may be red or purple and then turn into blisters or peeling of the skin. Or, you might notice a red rash with swelling of the face, lips or lymph nodes in your neck or under your arms. This medicine may increase your risk to bruise or bleed. Call your doctor or healthcare provider if you notice any unusual bleeding. Talk to your doctor about your risk of cancer. You may be more at risk for certain types of cancers if you take this medicine. Do not become pregnant while taking this medicine or for at least 6 months after stopping it. Women should inform their doctor if they wish to become pregnant or think they might be pregnant. Men should not  father a child while taking this medicine and for at least 3 months after stopping it. There is a potential for serious side effects to an unborn child. Talk to your healthcare provider or pharmacist for more information. Do not breast-feed an infant while taking this medicine or for at least 1 week after stopping it. This medicine may make it  more difficult to father a child. You should talk with your doctor or healthcare provider if you are concerned about your fertility. What side effects may I notice from receiving this medicine? Side effects that you should report to your doctor or health care professional as soon as possible:  allergic reactions like skin rash, itching or hives, swelling of the face, lips, or tongue  low blood counts - this medicine may decrease the number of white blood cells, red blood cells and platelets. You may be at increased risk for infections and bleeding.  rash, fever, and swollen lymph nodes  redness, blistering, peeling, or loosening of the skin, including inside the mouth  signs of infection like fever or chills, cough, sore throat, pain or difficulty passing urine  signs of decreased platelets or bleeding like bruising, pinpoint red spots on the skin, black, tarry stools, blood in the urine  signs of decreased red blood cells like being unusually weak or tired, fainting spells, lightheadedness  signs and symptoms of kidney injury like trouble passing urine or change in the amount of urine  signs and symptoms of liver injury like dark yellow or brown urine; general ill feeling or flu-like symptoms; light-colored stools; loss of appetite; nausea; right upper belly pain; unusually weak or tired; yellowing of the eyes or skin Side effects that usually do not require medical attention (report to your doctor or health care professional if they continue or are bothersome):  constipation  decreased appetite  diarrhea  headache  mouth sores  nausea, vomiting  tiredness This list may not describe all possible side effects. Call your doctor for medical advice about side effects. You may report side effects to FDA at 1-800-FDA-1088. Where should I keep my medicine? This drug is given in a hospital or clinic and will not be stored at home. NOTE: This sheet is a summary. It may not cover all  possible information. If you have questions about this medicine, talk to your doctor, pharmacist, or health care provider.  2020 Elsevier/Gold Standard (2018-10-06 10:26:46) Rituximab injection What is this medicine? RITUXIMAB (ri TUX i mab) is a monoclonal antibody. It is used to treat certain types of cancer like non-Hodgkin lymphoma and chronic lymphocytic leukemia. It is also used to treat rheumatoid arthritis, granulomatosis with polyangiitis (or Wegener's granulomatosis), microscopic polyangiitis, and pemphigus vulgaris. This medicine may be used for other purposes; ask your health care provider or pharmacist if you have questions. COMMON BRAND NAME(S): Rituxan, RUXIENCE What should I tell my health care provider before I take this medicine? They need to know if you have any of these conditions:  heart disease  infection (especially a virus infection such as hepatitis B, chickenpox, cold sores, or herpes)  immune system problems  irregular heartbeat  kidney disease  low blood counts, like low white cell, platelet, or red cell counts  lung or breathing disease, like asthma  recently received or scheduled to receive a vaccine  an unusual or allergic reaction to rituximab, other medicines, foods, dyes, or preservatives  pregnant or trying to get pregnant  breast-feeding How should I use this medicine? This medicine is  for infusion into a vein. It is administered in a hospital or clinic by a specially trained health care professional. A special MedGuide will be given to you by the pharmacist with each prescription and refill. Be sure to read this information carefully each time. Talk to your pediatrician regarding the use of this medicine in children. This medicine is not approved for use in children. Overdosage: If you think you have taken too much of this medicine contact a poison control center or emergency room at once. NOTE: This medicine is only for you. Do not share this  medicine with others. What if I miss a dose? It is important not to miss a dose. Call your doctor or health care professional if you are unable to keep an appointment. What may interact with this medicine?  cisplatin  live virus vaccines This list may not describe all possible interactions. Give your health care provider a list of all the medicines, herbs, non-prescription drugs, or dietary supplements you use. Also tell them if you smoke, drink alcohol, or use illegal drugs. Some items may interact with your medicine. What should I watch for while using this medicine? Your condition will be monitored carefully while you are receiving this medicine. You may need blood work done while you are taking this medicine. This medicine can cause serious allergic reactions. To reduce your risk you may need to take medicine before treatment with this medicine. Take your medicine as directed. In some patients, this medicine may cause a serious brain infection that may cause death. If you have any problems seeing, thinking, speaking, walking, or standing, tell your healthcare professional right away. If you cannot reach your healthcare professional, urgently seek other source of medical care. Call your doctor or health care professional for advice if you get a fever, chills or sore throat, or other symptoms of a cold or flu. Do not treat yourself. This drug decreases your body's ability to fight infections. Try to avoid being around people who are sick. Do not become pregnant while taking this medicine or for at least 12 months after stopping it. Women should inform their doctor if they wish to become pregnant or think they might be pregnant. There is a potential for serious side effects to an unborn child. Talk to your health care professional or pharmacist for more information. Do not breast-feed an infant while taking this medicine or for at least 6 months after stopping it. What side effects may I notice from  receiving this medicine? Side effects that you should report to your doctor or health care professional as soon as possible:  allergic reactions like skin rash, itching or hives; swelling of the face, lips, or tongue  breathing problems  chest pain  changes in vision  diarrhea  headache with fever, neck stiffness, sensitivity to light, nausea, or confusion  fast, irregular heartbeat  loss of memory  low blood counts - this medicine may decrease the number of white blood cells, red blood cells and platelets. You may be at increased risk for infections and bleeding.  mouth sores  problems with balance, talking, or walking  redness, blistering, peeling or loosening of the skin, including inside the mouth  signs of infection - fever or chills, cough, sore throat, pain or difficulty passing urine  signs and symptoms of kidney injury like trouble passing urine or change in the amount of urine  signs and symptoms of liver injury like dark yellow or brown urine; general ill feeling or flu-like  symptoms; light-colored stools; loss of appetite; nausea; right upper belly pain; unusually weak or tired; yellowing of the eyes or skin  signs and symptoms of low blood pressure like dizziness; feeling faint or lightheaded, falls; unusually weak or tired  stomach pain  swelling of the ankles, feet, hands  unusual bleeding or bruising  vomiting Side effects that usually do not require medical attention (report to your doctor or health care professional if they continue or are bothersome):  headache  joint pain  muscle cramps or muscle pain  nausea  tiredness This list may not describe all possible side effects. Call your doctor for medical advice about side effects. You may report side effects to FDA at 1-800-FDA-1088. Where should I keep my medicine? This drug is given in a hospital or clinic and will not be stored at home. NOTE: This sheet is a summary. It may not cover all  possible information. If you have questions about this medicine, talk to your doctor, pharmacist, or health care provider.  2020 Elsevier/Gold Standard (2018-08-26 22:01:36)

## 2020-05-17 NOTE — Telephone Encounter (Signed)
Scheduled appts per treatment plan and conversion from [facility]. Gave pt a print out of appt calendar.  

## 2020-05-17 NOTE — Progress Notes (Signed)
PT STABLE AT TIME OF DISCHARGE 

## 2020-05-17 NOTE — Progress Notes (Signed)
Doss the patient is a 64 year old gentleman

## 2020-05-18 ENCOUNTER — Inpatient Hospital Stay: Payer: Medicare Other

## 2020-05-18 VITALS — BP 147/76 | HR 58 | Temp 97.7°F | Resp 20 | Ht 72.0 in | Wt 213.0 lb

## 2020-05-18 DIAGNOSIS — C8382 Other non-follicular lymphoma, intrathoracic lymph nodes: Secondary | ICD-10-CM

## 2020-05-18 DIAGNOSIS — Z5189 Encounter for other specified aftercare: Secondary | ICD-10-CM | POA: Diagnosis not present

## 2020-05-18 DIAGNOSIS — Z5111 Encounter for antineoplastic chemotherapy: Secondary | ICD-10-CM | POA: Diagnosis not present

## 2020-05-18 DIAGNOSIS — Z5112 Encounter for antineoplastic immunotherapy: Secondary | ICD-10-CM | POA: Diagnosis not present

## 2020-05-18 DIAGNOSIS — C8302 Small cell B-cell lymphoma, intrathoracic lymph nodes: Secondary | ICD-10-CM | POA: Diagnosis not present

## 2020-05-18 MED ORDER — SODIUM CHLORIDE 0.9 % IV SOLN
Freq: Once | INTRAVENOUS | Status: AC
  Filled 2020-05-18: qty 250

## 2020-05-18 MED ORDER — HEPARIN SOD (PORK) LOCK FLUSH 100 UNIT/ML IV SOLN
500.0000 [IU] | Freq: Once | INTRAVENOUS | Status: AC | PRN
  Administered 2020-05-18: 500 [IU]
  Filled 2020-05-18: qty 5

## 2020-05-18 MED ORDER — SODIUM CHLORIDE 0.9 % IV SOLN
90.0000 mg/m2 | Freq: Once | INTRAVENOUS | Status: AC
  Administered 2020-05-18: 200 mg via INTRAVENOUS
  Filled 2020-05-18: qty 8

## 2020-05-18 MED ORDER — SODIUM CHLORIDE 0.9% FLUSH
10.0000 mL | INTRAVENOUS | Status: DC | PRN
  Administered 2020-05-18: 10 mL
  Filled 2020-05-18: qty 10

## 2020-05-18 MED ORDER — SODIUM CHLORIDE 0.9 % IV SOLN
10.0000 mg | Freq: Once | INTRAVENOUS | Status: AC
  Administered 2020-05-18: 10 mg via INTRAVENOUS
  Filled 2020-05-18: qty 10

## 2020-05-18 NOTE — Progress Notes (Signed)
PT STABLE AT TIME OF DISCHARGE 

## 2020-05-18 NOTE — Patient Instructions (Signed)
Bendamustine Injection What is this medicine? BENDAMUSTINE (BEN da MUS teen) is a chemotherapy drug. It is used to treat chronic lymphocytic leukemia and non-Hodgkin lymphoma. This medicine may be used for other purposes; ask your health care provider or pharmacist if you have questions. COMMON BRAND NAME(S): BELRAPZO, BENDEKA, Treanda What should I tell my health care provider before I take this medicine? They need to know if you have any of these conditions:  infection (especially a virus infection such as chickenpox, cold sores, or herpes)  kidney disease  liver disease  an unusual or allergic reaction to bendamustine, mannitol, other medicines, foods, dyes, or preservatives  pregnant or trying to get pregnant  breast-feeding How should I use this medicine? This medicine is for infusion into a vein. It is given by a health care professional in a hospital or clinic setting. Talk to your pediatrician regarding the use of this medicine in children. Special care may be needed. Overdosage: If you think you have taken too much of this medicine contact a poison control center or emergency room at once. NOTE: This medicine is only for you. Do not share this medicine with others. What if I miss a dose? It is important not to miss your dose. Call your doctor or health care professional if you are unable to keep an appointment. What may interact with this medicine? Do not take this medicine with any of the following medications:  clozapine This medicine may also interact with the following medications:  atazanavir  cimetidine  ciprofloxacin  enoxacin  fluvoxamine  medicines for seizures like carbamazepine and phenobarbital  mexiletine  rifampin  tacrine  thiabendazole  zileuton This list may not describe all possible interactions. Give your health care provider a list of all the medicines, herbs, non-prescription drugs, or dietary supplements you use. Also tell them if  you smoke, drink alcohol, or use illegal drugs. Some items may interact with your medicine. What should I watch for while using this medicine? This drug may make you feel generally unwell. This is not uncommon, as chemotherapy can affect healthy cells as well as cancer cells. Report any side effects. Continue your course of treatment even though you feel ill unless your doctor tells you to stop. You may need blood work done while you are taking this medicine. Call your doctor or healthcare provider for advice if you get a fever, chills or sore throat, or other symptoms of a cold or flu. Do not treat yourself. This drug decreases your body's ability to fight infections. Try to avoid being around people who are sick. This medicine may cause serious skin reactions. They can happen weeks to months after starting the medicine. Contact your healthcare provider right away if you notice fevers or flu-like symptoms with a rash. The rash may be red or purple and then turn into blisters or peeling of the skin. Or, you might notice a red rash with swelling of the face, lips or lymph nodes in your neck or under your arms. This medicine may increase your risk to bruise or bleed. Call your doctor or healthcare provider if you notice any unusual bleeding. Talk to your doctor about your risk of cancer. You may be more at risk for certain types of cancers if you take this medicine. Do not become pregnant while taking this medicine or for at least 6 months after stopping it. Women should inform their doctor if they wish to become pregnant or think they might be pregnant. Men should not   father a child while taking this medicine and for at least 3 months after stopping it. There is a potential for serious side effects to an unborn child. Talk to your healthcare provider or pharmacist for more information. Do not breast-feed an infant while taking this medicine or for at least 1 week after stopping it. This medicine may make it  more difficult to father a child. You should talk with your doctor or healthcare provider if you are concerned about your fertility. What side effects may I notice from receiving this medicine? Side effects that you should report to your doctor or health care professional as soon as possible:  allergic reactions like skin rash, itching or hives, swelling of the face, lips, or tongue  low blood counts - this medicine may decrease the number of white blood cells, red blood cells and platelets. You may be at increased risk for infections and bleeding.  rash, fever, and swollen lymph nodes  redness, blistering, peeling, or loosening of the skin, including inside the mouth  signs of infection like fever or chills, cough, sore throat, pain or difficulty passing urine  signs of decreased platelets or bleeding like bruising, pinpoint red spots on the skin, black, tarry stools, blood in the urine  signs of decreased red blood cells like being unusually weak or tired, fainting spells, lightheadedness  signs and symptoms of kidney injury like trouble passing urine or change in the amount of urine  signs and symptoms of liver injury like dark yellow or brown urine; general ill feeling or flu-like symptoms; light-colored stools; loss of appetite; nausea; right upper belly pain; unusually weak or tired; yellowing of the eyes or skin Side effects that usually do not require medical attention (report to your doctor or health care professional if they continue or are bothersome):  constipation  decreased appetite  diarrhea  headache  mouth sores  nausea, vomiting  tiredness This list may not describe all possible side effects. Call your doctor for medical advice about side effects. You may report side effects to FDA at 1-800-FDA-1088. Where should I keep my medicine? This drug is given in a hospital or clinic and will not be stored at home. NOTE: This sheet is a summary. It may not cover all  possible information. If you have questions about this medicine, talk to your doctor, pharmacist, or health care provider.  2020 Elsevier/Gold Standard (2018-10-06 10:26:46)  

## 2020-05-19 ENCOUNTER — Inpatient Hospital Stay: Payer: Medicare Other

## 2020-05-19 ENCOUNTER — Other Ambulatory Visit: Payer: Self-pay

## 2020-05-19 VITALS — BP 142/67 | HR 65 | Temp 97.6°F | Resp 18 | Ht 72.0 in | Wt 215.8 lb

## 2020-05-19 DIAGNOSIS — C8382 Other non-follicular lymphoma, intrathoracic lymph nodes: Secondary | ICD-10-CM

## 2020-05-19 DIAGNOSIS — C8302 Small cell B-cell lymphoma, intrathoracic lymph nodes: Secondary | ICD-10-CM | POA: Diagnosis not present

## 2020-05-19 DIAGNOSIS — Z5111 Encounter for antineoplastic chemotherapy: Secondary | ICD-10-CM | POA: Diagnosis not present

## 2020-05-19 DIAGNOSIS — Z5112 Encounter for antineoplastic immunotherapy: Secondary | ICD-10-CM | POA: Diagnosis not present

## 2020-05-19 DIAGNOSIS — Z5189 Encounter for other specified aftercare: Secondary | ICD-10-CM | POA: Diagnosis not present

## 2020-05-19 MED ORDER — PEGFILGRASTIM-BMEZ 6 MG/0.6ML ~~LOC~~ SOSY
PREFILLED_SYRINGE | SUBCUTANEOUS | Status: AC
  Filled 2020-05-19: qty 0.6

## 2020-05-19 MED ORDER — PEGFILGRASTIM-BMEZ 6 MG/0.6ML ~~LOC~~ SOSY
6.0000 mg | PREFILLED_SYRINGE | Freq: Once | SUBCUTANEOUS | Status: AC
  Administered 2020-05-19: 6 mg via SUBCUTANEOUS

## 2020-05-19 NOTE — Patient Instructions (Signed)
[facility] Discharge Instructions for Patients Receiving Chemotherapy  Today you received the following chemotherapy agents Ziextenzo  To help prevent nausea and vomiting after your treatment, we encourage you to take your nausea medication as ordered by MD.   If you develop nausea and vomiting that is not controlled by your nausea medication, call the clinic.   BELOW ARE SYMPTOMS THAT SHOULD BE REPORTED IMMEDIATELY:  *FEVER GREATER THAN 100.5 F  *CHILLS WITH OR WITHOUT FEVER  NAUSEA AND VOMITING THAT IS NOT CONTROLLED WITH YOUR NAUSEA MEDICATION  *UNUSUAL SHORTNESS OF BREATH  *UNUSUAL BRUISING OR BLEEDING  TENDERNESS IN MOUTH AND THROAT WITH OR WITHOUT PRESENCE OF ULCERS  *URINARY PROBLEMS  *BOWEL PROBLEMS  UNUSUAL RASH Items with * indicate a potential emergency and should be followed up as soon as possible.  Feel free to call the clinic should you have any questions or concerns at The clinic phone number is [phone].  Please show the [facility] at check-in to the Emergency Department and triage nurse.

## 2020-05-19 NOTE — Progress Notes (Signed)
Patient stable at discharge.

## 2020-05-22 DIAGNOSIS — J189 Pneumonia, unspecified organism: Secondary | ICD-10-CM | POA: Diagnosis not present

## 2020-05-22 DIAGNOSIS — J449 Chronic obstructive pulmonary disease, unspecified: Secondary | ICD-10-CM | POA: Diagnosis not present

## 2020-05-30 ENCOUNTER — Other Ambulatory Visit: Payer: Self-pay

## 2020-05-31 ENCOUNTER — Encounter: Payer: Self-pay | Admitting: Cardiology

## 2020-05-31 ENCOUNTER — Other Ambulatory Visit: Payer: Self-pay

## 2020-05-31 ENCOUNTER — Ambulatory Visit (INDEPENDENT_AMBULATORY_CARE_PROVIDER_SITE_OTHER): Payer: Medicare Other | Admitting: Cardiology

## 2020-05-31 VITALS — BP 136/78 | HR 76 | Ht 72.0 in | Wt 210.0 lb

## 2020-05-31 DIAGNOSIS — I10 Essential (primary) hypertension: Secondary | ICD-10-CM | POA: Diagnosis not present

## 2020-05-31 DIAGNOSIS — I2510 Atherosclerotic heart disease of native coronary artery without angina pectoris: Secondary | ICD-10-CM

## 2020-05-31 DIAGNOSIS — I420 Dilated cardiomyopathy: Secondary | ICD-10-CM

## 2020-05-31 DIAGNOSIS — F17210 Nicotine dependence, cigarettes, uncomplicated: Secondary | ICD-10-CM | POA: Diagnosis not present

## 2020-05-31 DIAGNOSIS — C8510 Unspecified B-cell lymphoma, unspecified site: Secondary | ICD-10-CM | POA: Diagnosis not present

## 2020-05-31 NOTE — Progress Notes (Signed)
ekg 

## 2020-05-31 NOTE — Patient Instructions (Signed)
Medication Instructions:  °Your physician recommends that you continue on your current medications as directed. Please refer to the Current Medication list given to you today. ° °*If you need a refill on your cardiac medications before your next appointment, please call your pharmacy* ° ° °Lab Work: °None. ° °If you have labs (blood work) drawn today and your tests are completely normal, you will receive your results only by: °• MyChart Message (if you have MyChart) OR °• A paper copy in the mail °If you have any lab test that is abnormal or we need to change your treatment, we will call you to review the results. ° ° °Testing/Procedures: °Your physician has requested that you have an echocardiogram. Echocardiography is a painless test that uses sound waves to create images of your heart. It provides your doctor with information about the size and shape of your heart and how well your heart’s chambers and valves are working. This procedure takes approximately one hour. There are no restrictions for this procedure. ° ° ° ° °Follow-Up: °At [facility], you and your health needs are our priority.  As part of our continuing mission to provide you with exceptional heart care, we have created designated Provider Care Teams.  These Care Teams include your primary Cardiologist (physician) and Advanced Practice Providers (APPs -  Physician Assistants and Nurse Practitioners) who all work together to provide you with the care you need, when you need it. ° °We recommend signing up for the patient portal called "MyChart".  Sign up information is provided on this After Visit Summary.  MyChart is used to connect with patients for Virtual Visits (Telemedicine).  Patients are able to view lab/test results, encounter notes, upcoming appointments, etc.  Non-urgent messages can be sent to your provider as well.   °To learn more about what you can do with MyChart, go to [email or web address]://[email or web address].com.   ° °Your next appointment:   °6  month(s) ° °The format for your next appointment:   °In Person ° °Provider:   °Robert Krasowski, MD ° ° °Other Instructions ° ° °Echocardiogram °An echocardiogram is a procedure that uses painless sound waves (ultrasound) to produce an image of the heart. Images from an echocardiogram can provide important information about: °· Signs of coronary artery disease (CAD). °· Aneurysm detection. An aneurysm is a weak or damaged part of an artery wall that bulges out from the normal force of blood pumping through the body. °· Heart size and shape. Changes in the size or shape of the heart can be associated with certain conditions, including heart failure, aneurysm, and CAD. °· Heart muscle function. °· Heart valve function. °· Signs of a past heart attack. °· Fluid buildup around the heart. °· Thickening of the heart muscle. °· A tumor or infectious growth around the heart valves. °Tell a health care provider about: °· Any allergies you have. °· All medicines you are taking, including vitamins, herbs, eye drops, creams, and over-the-counter medicines. °· Any blood disorders you have. °· Any surgeries you have had. °· Any medical conditions you have. °· Whether you are pregnant or may be pregnant. °What are the risks? °Generally, this is a safe procedure. However, problems may occur, including: °· Allergic reaction to dye (contrast) that may be used during the procedure. °What happens before the procedure? °No specific preparation is needed. You may eat and drink normally. °What happens during the procedure? ° °· An IV tube may be inserted into one of your veins. °· You may   receive contrast through this tube. A contrast is an injection that improves the quality of the pictures from your heart. °· A gel will be applied to your chest. °· A wand-like tool (transducer) will be moved over your chest. The gel will help to transmit the sound waves from the transducer. °· The sound waves will harmlessly bounce off of your heart to  allow the heart images to be captured in real-time motion. The images will be recorded on a computer. °The procedure may vary among health care providers and hospitals. °What happens after the procedure? °· You may return to your normal, everyday life, including diet, activities, and medicines, unless your health care provider tells you not to do that. °Summary °· An echocardiogram is a procedure that uses painless sound waves (ultrasound) to produce an image of the heart. °· Images from an echocardiogram can provide important information about the size and shape of your heart, heart muscle function, heart valve function, and fluid buildup around your heart. °· You do not need to do anything to prepare before this procedure. You may eat and drink normally. °· After the echocardiogram is completed, you may return to your normal, everyday life, unless your health care provider tells you not to do that. °This information is not intended to replace advice given to you by your health care provider. Make sure you discuss any questions you have with your health care provider. °Document Revised: 11/05/2018 Document Reviewed: 08/17/2016 °Elsevier Patient Education © 2020 [employer]. ° ° °

## 2020-05-31 NOTE — Addendum Note (Signed)
Addended by: Senaida Ores on: 05/31/2020 08:32 AM   Modules accepted: Orders

## 2020-05-31 NOTE — Progress Notes (Signed)
Cardiology Office Note:    Date:  05/31/2020   ID:  Antonio Benson, DOB June 19, 1956, MRN [id number]  PCP:  Antonio Sheriff, MD  Cardiologist:  Antonio Campus, MD    Referring MD: Antonio Sheriff, MD   No chief complaint on file. I am doing well cardiac wise but I am taking chemotherapy form of lymphoma  History of Present Illness:    Antonio Benson is a 64 y.o. male with past medical history significant for coronary artery disease status post myocardial infarction 2015, that required stenting.  Chronic smoker, COPD, dyslipidemia, history of cardiomyopathy but normalization.  Recently he was diagnosed with B-cell lymphoma he is being treated for this with chemotherapy.  Cardiac wise doing well described to have some fatigue tiredness and shortness of breath and he blames this on chemotherapy and he is probably right.  Denies having any chest pain tightness squeezing pressure burning chest no swelling of lower extremities.  He did have 3 courses of chemotherapy he does have 3 more to go he expected finished with his chemotherapy sometimes in January or February.  Past Medical History:  Diagnosis Date  . Anemia    low iron  . Arthritis   . COPD (chronic obstructive pulmonary disease) ([place])   . Coronary artery disease    1 stent  . GERD (gastroesophageal reflux disease)   . Headache   . Hyperlipidemia   . Hypertension   . Myocardial infarction ([place])    2015 or 2016  . Pneumonia   . Sinus trouble   . Sleep apnea    no longer using Cpap due to weight loss    Past Surgical History:  Procedure Laterality Date  . CARDIAC CATHETERIZATION    . CORONARY ANGIOPLASTY    . KNEE SURGERY Left    arthroscopy - meniscus tear  . MEDIASTINOSCOPY N/A 05/24/2019   Procedure: MEDIASTINOSCOPY;  Surgeon: Melrose Nakayama, MD;  Location[facility] OR;  Service: Thoracic;  Laterality: N/A;  . NASAL SINUS SURGERY  1990's   deviated septum   . TONSILLECTOMY      Current  Medications: Current Meds  Medication Sig  . albuterol (PROVENTIL HFA;VENTOLIN HFA) 108 (90 BASE) MCG/ACT inhaler Inhale 2 puffs into the lungs every 6 (six) hours as needed for wheezing or shortness of breath.  Marland Kitchen albuterol (PROVENTIL) (2.5 MG/3ML) 0.083% nebulizer solution Take 3 mLs (2.5 mg total) by nebulization every 6 (six) hours as needed for wheezing or shortness of breath.  Marland Kitchen aspirin 81 MG tablet Take 81 mg by mouth daily.  Marland Kitchen atorvastatin (LIPITOR) 80 MG tablet TAKE 1 TABLET BY MOUTH DAILY AT 6 PM.  . carvedilol (COREG) 3.125 MG tablet TAKE 1 TABLET (3.125 MG TOTAL) BY MOUTH 2 (TWO) TIMES DAILY.  . ferrous sulfate 325 (65 FE) MG tablet Take 325 mg by mouth daily.  Marland Kitchen ipratropium-albuterol (DUONEB) 0.5-2.5 (3) MG/3ML SOLN SMARTSIG:1 Vial(s) Via Nebulizer Every 4-6 Hours PRN  . Multiple Vitamin (MULTIVITAMIN WITH MINERALS) TABS tablet Take 1 tablet by mouth daily.  . nitroGLYCERIN (NITROSTAT) 0.4 MG SL tablet PLACE 1 TABLET (0.4 MG TOTAL) UNDER THE TONGUE EVERY 5 (FIVE) MINUTES AS NEEDED FOR CHEST PAIN.  Marland Kitchen ondansetron (ZOFRAN) 4 MG tablet Take 4 mg by mouth every 8 (eight) hours as needed for nausea or vomiting.  . prochlorperazine (COMPAZINE) 10 MG tablet Take 10 mg by mouth every 6 (six) hours as needed for nausea or vomiting.  . sodium chloride (OCEAN) 0.65 % SOLN  nasal spray Place 1 spray into both nostrils as needed for congestion.  . TRELEGY ELLIPTA 100-62.5-25 MCG/INH AEPB Inhale 1 puff into the lungs daily.     Allergies:   Patient has no known allergies.   Social History   Socioeconomic History  . Marital status: Married    Spouse name: Not on file  . Number of children: Not on file  . Years of education: Not on file  . Highest education level: Not on file  Occupational History  . Occupation: mail carrier  Tobacco Use  . Smoking status: Current Every Day Smoker    Packs/day: 1.00    Years: 40.00    Pack years: 40.00    Types: Cigarettes  . Smokeless tobacco: Never  Used  Vaping Use  . Vaping Use: Never used  Substance and Sexual Activity  . Alcohol use: No  . Drug use: No  . Sexual activity: Yes  Other Topics Concern  . Not on file  Social History Narrative  . Not on file   Social Determinants of Health   Financial Resource Strain:   . Difficulty of Paying Living Expenses: Not on file  Food Insecurity:   . Worried About Charity fundraiser in the Last Year: Not on file  . Ran Out of Food in the Last Year: Not on file  Transportation Needs:   . Lack of Transportation (Medical): Not on file  . Lack of Transportation (Non-Medical): Not on file  Physical Activity:   . Days of Exercise per Week: Not on file  . Minutes of Exercise per Session: Not on file  Stress:   . Feeling of Stress : Not on file  Social Connections:   . Frequency of Communication with Friends and Family: Not on file  . Frequency of Social Gatherings with Friends and Family: Not on file  . Attends Religious Services: Not on file  . Active Member of Clubs or Organizations: Not on file  . Attends Archivist Meetings: Not on file  . Marital Status: Not on file     Family History: The patient's family history includes Aneurysm in his father; Asthma in his mother. ROS:   Please see the history of present illness.    All 14 point review of systems negative except as described per history of present illness  EKGs/Labs/Other Studies Reviewed:      Recent Labs: 05/16/2020: ALT 11; BUN 11; Creatinine 0.9; Hemoglobin 9.1; Platelets 258; Potassium 3.5; Sodium 141  Recent Lipid Panel    Component Value Date/Time   CHOL 95 (L) 09/28/2019 1531   TRIG 133 09/28/2019 1531   HDL 22 (L) 09/28/2019 1531   CHOLHDL 4.3 09/28/2019 1531   LDLCALC 49 09/28/2019 1531    Physical Exam:    VS:  BP 136/78   Pulse 76   Ht 6' (1.829 m)   Wt 210 lb (95.3 kg)   SpO2 96%   BMI 28.48 kg/m     Wt Readings from Last 3 Encounters:  05/31/20 210 lb (95.3 kg)  05/22/20 208  lb 7 oz (94.5 kg)  05/19/20 215 lb 12 oz (97.9 kg)     GEN:  Well nourished, well developed in no acute distress HEENT: Normal NECK: No JVD; No carotid bruits LYMPHATICS: No lymphadenopathy CARDIAC: RRR, no murmurs, no rubs, no gallops RESPIRATORY:  Clear to auscultation without rales, wheezing or rhonchi  ABDOMEN: Soft, non-tender, non-distended MUSCULOSKELETAL:  No edema; No deformity  SKIN: Warm and dry LOWER  EXTREMITIES: no swelling NEUROLOGIC:  Alert and oriented x 3 PSYCHIATRIC:  Normal affect   ASSESSMENT:    1. Coronary artery disease involving native coronary artery of native heart without angina pectoris   2. Dilated cardiomyopathy ([place])   3. Primary hypertension   4. B-cell lymphoma, unspecified B-cell lymphoma type, unspecified body region ([place])   5. Cigarette smoker    PLAN:    In order of problems listed above:  1. Coronary artery disease, status post PTA and stenting in 2015.  Doing well from that point review on appropriate medication which include antiplatelets therapy.  I will continue that management. 2. History of cardiomyopathy.  He complained of having weakness and fatigue which is probably related to chemotherapy however I will ask him to have an echocardiogram to recheck his left ventricle ejection fraction.  The medication that he takes right now include Coreg but only 3.125 twice daily, there is no ACE inhibitor or ARB.  Therefore checking his ejection fraction is important to make a decision if we need to upgrade his therapy. 3. Essential hypertension his blood pressure is reasonably controlled today continue present management. 4. B-cell lymphoma follow-up by oncology on chemotherapy. 5. Sad news is the fact that he still continues to smoke.  Again we had discussion about this he understand he need to quit but I would not start talking to him about this he is just joking.  Honestly I do not think his ended position to quit right now but will continue this  discussion. 6. I did review notes from oncology for this visit   Medication Adjustments/Labs and Tests Ordered: Current medicines are reviewed at length with the patient today.  Concerns regarding medicines are outlined above.  No orders of the defined types were placed in this encounter.  Medication changes: No orders of the defined types were placed in this encounter.   Signed, Park Liter, MD, [facility] 05/31/2020 8:25 AM    [place]

## 2020-06-06 DIAGNOSIS — J449 Chronic obstructive pulmonary disease, unspecified: Secondary | ICD-10-CM | POA: Diagnosis not present

## 2020-06-06 DIAGNOSIS — Z1331 Encounter for screening for depression: Secondary | ICD-10-CM | POA: Diagnosis not present

## 2020-06-06 DIAGNOSIS — Z0000 Encounter for general adult medical examination without abnormal findings: Secondary | ICD-10-CM | POA: Diagnosis not present

## 2020-06-06 DIAGNOSIS — E7800 Pure hypercholesterolemia, unspecified: Secondary | ICD-10-CM | POA: Diagnosis not present

## 2020-06-06 DIAGNOSIS — Z79899 Other long term (current) drug therapy: Secondary | ICD-10-CM | POA: Diagnosis not present

## 2020-06-06 DIAGNOSIS — Z9181 History of falling: Secondary | ICD-10-CM | POA: Diagnosis not present

## 2020-06-06 DIAGNOSIS — Z6829 Body mass index (BMI) 29.0-29.9, adult: Secondary | ICD-10-CM | POA: Diagnosis not present

## 2020-06-08 DIAGNOSIS — J90 Pleural effusion, not elsewhere classified: Secondary | ICD-10-CM | POA: Diagnosis not present

## 2020-06-08 DIAGNOSIS — N200 Calculus of kidney: Secondary | ICD-10-CM | POA: Diagnosis not present

## 2020-06-08 DIAGNOSIS — C8382 Other non-follicular lymphoma, intrathoracic lymph nodes: Secondary | ICD-10-CM | POA: Diagnosis not present

## 2020-06-08 DIAGNOSIS — M4319 Spondylolisthesis, multiple sites in spine: Secondary | ICD-10-CM | POA: Diagnosis not present

## 2020-06-08 DIAGNOSIS — J939 Pneumothorax, unspecified: Secondary | ICD-10-CM | POA: Diagnosis not present

## 2020-06-08 DIAGNOSIS — C8290 Follicular lymphoma, unspecified, unspecified site: Secondary | ICD-10-CM | POA: Diagnosis not present

## 2020-06-08 DIAGNOSIS — K8020 Calculus of gallbladder without cholecystitis without obstruction: Secondary | ICD-10-CM | POA: Diagnosis not present

## 2020-06-11 NOTE — Progress Notes (Signed)
[facility]  [address] [place],  [place]  [address] [phone]  Clinic Day:  06/12/2020  Referring physician: [name], MD   HISTORY OF PRESENT ILLNESS:  The patient is a 64 y.o. male with marginal zone lymphoma, which was proven per a mediastinoscopy with lymph node biopsy in October 2020.  A bone marrow biopsy also showed a lymphohistiocytic infiltration that was worrisome for either his lymphoma or an immune response to it.  He comes in today to review his CT scans to ascertain his new disease baseline after receiving 3 cycles of bendamustine/Rituxan.  The patient claims to have tolerated his 3rd cycle of treatment fairly well.  Although fatigue remians an issue, he still appears to be doing okay.  He denies having any B symptoms or peripheral lymphadenopathy which concerns him for progression of his marginal zone lymphoma while on treatment.  PHYSICAL EXAM:  Blood pressure (!) 158/90, pulse 66, temperature 98.2 F (36.8 C), temperature source Oral, resp. rate 16, height 5' 10.5" (1.791 m), weight 214 lb (97.1 kg), SpO2 97 %. Wt Readings from Last 3 Encounters:  06/12/20 214 lb (97.1 kg)  05/31/20 210 lb (95.3 kg)  05/22/20 208 lb 7 oz (94.5 kg)   Body mass index is 30.27 kg/m. Performance status (ECOG): 1 Physical Exam Constitutional:      Appearance: Normal appearance. He is not ill-appearing.  HENT:     Mouth/Throat:     Mouth: Mucous membranes are moist.     Pharynx: Oropharynx is clear. No oropharyngeal exudate or posterior oropharyngeal erythema.  Cardiovascular:     Rate and Rhythm: Normal rate and regular rhythm.     Heart sounds: No murmur heard.  No friction rub. No gallop.   Pulmonary:     Effort: Pulmonary effort is normal. No respiratory distress.     Breath sounds: Normal breath sounds. No wheezing, rhonchi or rales.  Abdominal:     General: Bowel sounds are normal. There is no distension.     Palpations:  Abdomen is soft. There is no mass.     Tenderness: There is no abdominal tenderness.  Musculoskeletal:        General: No swelling.     Right lower leg: No edema.     Left lower leg: No edema.  Lymphadenopathy:     Cervical: No cervical adenopathy.     Upper Body:     Right upper body: No supraclavicular or axillary adenopathy.     Left upper body: No supraclavicular or axillary adenopathy.     Lower Body: No right inguinal adenopathy. No left inguinal adenopathy.  Skin:    General: Skin is warm.     Coloration: Skin is not jaundiced.     Findings: No lesion or rash.  Neurological:     General: No focal deficit present.     Mental Status: He is alert and oriented to person, place, and time. Mental status is at baseline.     Cranial Nerves: Cranial nerves are intact.  Psychiatric:        Mood and Affect: Mood normal.        Behavior: Behavior normal.        Thought Content: Thought content normal.     LABS:       STUDIES:  His CT scans revealed the following: FINDINGS: CT CHEST FINDINGS  Cardiovascular: Left Port-A-Cath tip at high right atrium. Aortic atherosclerosis. Normal heart size, without pericardial effusion. Multivessel  coronary artery atherosclerosis. No central pulmonary embolism, on this non-dedicated study.  Mediastinum/Nodes: No supraclavicular adenopathy. Resolution of left axillary/subpectoral adenopathy.  Decreased size and number of the middle mediastinal nodes, none pathologic by size criteria. No hilar adenopathy. A prevascular node measures 7 mm on 21/2 versus 8 mm on the prior exam, upper normal today.  Lungs/Pleura: Small right pleural effusion, significantly decreased clear to the prior CT. The right-sided pneumothorax has resolved.  Probable secretions in the dependent trachea. Moderate centrilobular emphysema. Right upper lobe peribronchovascular "tree-in-bud" nodularity with somewhat more focal subpleural ground-glass  with architectural distortion on 43/4. The peribronchovascular nodularity is slightly improved while the area of more focal architectural distortion is progressive compared to 02/22/2020.  Lingular peribronchovascular consolidation is increased, with a somewhat more nodular component measuring on the order of 1.4 x 1.9 cm on 67/4, increased.  Musculoskeletal: No acute osseous abnormality.  CT ABDOMEN PELVIS FINDINGS  Hepatobiliary: Normal liver. Stone in the gallbladder neck of 5 mm. No acute cholecystitis or biliary duct dilatation.  Pancreas: Normal, without mass or ductal dilatation.  Spleen: Maximal transverse splenic size of 13.9 cm today versus 15.5 cm on the prior. No focal lesion.  Adrenals/Urinary Tract: Normal adrenal glands. Punctate right renal collecting system calculi. Normal left kidney, without hydronephrosis. Normal urinary bladder.  Stomach/Bowel: Normal stomach, without wall thickening. Normal colon and terminal ileum. Normal small bowel.  Vascular/Lymphatic: Aortic atherosclerosis. Index aortocaval node measures 4 mm on 73/2 versus 7 mm on the prior exam (when remeasured).  A right external iliac 6 mm node on 117/2 is decreased from 11 mm on the prior (when remeasured).  Reproductive: Normal prostate.  Other: Trace cul-de-sac fluid on 103/2, new.  Musculoskeletal: Bilateral L5 pars defects with grade 1 L5-S1 anterolisthesis and degenerative disc disease.  IMPRESSION: CT CHEST IMPRESSION  1. Decrease in thoracic nodes size.  No thoracic adenopathy. 2. Decreased small right pleural effusion with resolved pneumothorax. 3. Persistent upper lobe opacities, improved on the right and progressive on the left. Considerations include persistent/recurrent infection versus lymphomatous involvement. Correlate with infectious symptoms.  CT ABDOMEN AND PELVIS IMPRESSION  1. Decrease in splenomegaly. Further decrease in size of abdominopelvic nodes, without  adenopathy. 2. New trace cul-de-sac fluid, nonspecific. 3. Right nephrolithiasis. 4. Cholelithiasis.  ASSESSMENT & PLAN:   Assessment/Plan:  A 64 y.o. male with marginal zone lymphoma.  In clinic today, I went over his CT scan images with him, for which he could see the disease improvement with his lymphoma.  Understandably, the patient was pleased with these results.  Furthermore, his peripheral counts continue to improve.  Based upon this, he will proceed with his 4th cycle of bendamustine/Rituxan this week.  Clinically, the patient appears to be doing better. I will see him back in 4 weeks before he heads into his 5th cycle of treatment. The patient understands all the plans discussed today and is in agreement with them.      Alta Goding Macarthur Critchley, MD

## 2020-06-12 ENCOUNTER — Other Ambulatory Visit: Payer: Self-pay

## 2020-06-12 ENCOUNTER — Other Ambulatory Visit: Payer: Self-pay | Admitting: Oncology

## 2020-06-12 ENCOUNTER — Inpatient Hospital Stay (INDEPENDENT_AMBULATORY_CARE_PROVIDER_SITE_OTHER): Payer: Medicare Other | Admitting: Oncology

## 2020-06-12 ENCOUNTER — Inpatient Hospital Stay: Payer: Medicare Other | Attending: Oncology

## 2020-06-12 ENCOUNTER — Telehealth: Payer: Self-pay | Admitting: Oncology

## 2020-06-12 VITALS — BP 158/90 | HR 66 | Temp 98.2°F | Resp 16 | Ht 70.5 in | Wt 214.0 lb

## 2020-06-12 DIAGNOSIS — C8382 Other non-follicular lymphoma, intrathoracic lymph nodes: Secondary | ICD-10-CM

## 2020-06-12 DIAGNOSIS — Z79899 Other long term (current) drug therapy: Secondary | ICD-10-CM | POA: Diagnosis not present

## 2020-06-12 DIAGNOSIS — C8302 Small cell B-cell lymphoma, intrathoracic lymph nodes: Secondary | ICD-10-CM | POA: Insufficient documentation

## 2020-06-12 DIAGNOSIS — Z5112 Encounter for antineoplastic immunotherapy: Secondary | ICD-10-CM | POA: Insufficient documentation

## 2020-06-12 DIAGNOSIS — Z5111 Encounter for antineoplastic chemotherapy: Secondary | ICD-10-CM | POA: Insufficient documentation

## 2020-06-12 DIAGNOSIS — I2510 Atherosclerotic heart disease of native coronary artery without angina pectoris: Secondary | ICD-10-CM | POA: Diagnosis not present

## 2020-06-12 DIAGNOSIS — Z5189 Encounter for other specified aftercare: Secondary | ICD-10-CM | POA: Insufficient documentation

## 2020-06-12 LAB — CBC WITH DIFFERENTIAL (CANCER CENTER ONLY)
Abs Immature Granulocytes: 0 10*3/uL (ref 0.00–0.07)
Basophils Absolute: 0.1 10*3/uL (ref 0.0–0.1)
Basophils Relative: 5 %
Eosinophils Absolute: 0.2 10*3/uL (ref 0.0–0.5)
Eosinophils Relative: 9 %
HCT: 37.8 % — ABNORMAL LOW (ref 39.0–52.0)
Hemoglobin: 11.9 g/dL — ABNORMAL LOW (ref 13.0–17.0)
Immature Granulocytes: 0 %
Lymphocytes Relative: 16 %
Lymphs Abs: 0.4 10*3/uL — ABNORMAL LOW (ref 0.7–4.0)
MCH: 30.5 pg (ref 26.0–34.0)
MCHC: 31.5 g/dL (ref 30.0–36.0)
MCV: 96.9 fL (ref 80.0–100.0)
Monocytes Absolute: 0.7 10*3/uL (ref 0.1–1.0)
Monocytes Relative: 31 %
Neutro Abs: 0.9 10*3/uL — ABNORMAL LOW (ref 1.7–7.7)
Neutrophils Relative %: 39 %
Platelet Count: 142 10*3/uL — ABNORMAL LOW (ref 150–400)
RBC: 3.9 MIL/uL — ABNORMAL LOW (ref 4.22–5.81)
RDW: 17.2 % — ABNORMAL HIGH (ref 11.5–15.5)
WBC Count: 2.2 10*3/uL — ABNORMAL LOW (ref 4.0–10.5)
nRBC: 0 % (ref 0.0–0.2)

## 2020-06-12 LAB — CMP (CANCER CENTER ONLY)
ALT: 15 U/L (ref 0–44)
AST: 26 U/L (ref 15–41)
Albumin: 3.4 g/dL — ABNORMAL LOW (ref 3.5–5.0)
Alkaline Phosphatase: 83 U/L (ref 38–126)
Anion gap: 8 (ref 5–15)
BUN: 5 mg/dL — ABNORMAL LOW (ref 8–23)
CO2: 28 mmol/L (ref 22–32)
Calcium: 8.7 mg/dL — ABNORMAL LOW (ref 8.9–10.3)
Chloride: 108 mmol/L (ref 98–111)
Creatinine: 0.96 mg/dL (ref 0.61–1.24)
GFR, Estimated: >60 mL/min (ref >60)
Glucose, Bld: 84 mg/dL (ref 70–99)
Potassium: 3.5 mmol/L (ref 3.5–5.1)
Sodium: 144 mmol/L (ref 135–145)
Total Bilirubin: 0.6 mg/dL (ref 0.3–1.2)
Total Protein: 5.9 g/dL — ABNORMAL LOW (ref 6.5–8.1)

## 2020-06-12 NOTE — Telephone Encounter (Signed)
Per 11/15 LOS - Patient's 12/15 Lab, Follow up schedule - Gave patient appt

## 2020-06-13 ENCOUNTER — Other Ambulatory Visit: Payer: Self-pay | Admitting: Oncology

## 2020-06-14 ENCOUNTER — Other Ambulatory Visit: Payer: Self-pay

## 2020-06-14 ENCOUNTER — Inpatient Hospital Stay: Payer: Medicare Other

## 2020-06-14 VITALS — BP 161/83 | HR 58 | Temp 98.0°F | Resp 16 | Ht 70.5 in | Wt 215.2 lb

## 2020-06-14 DIAGNOSIS — Z5112 Encounter for antineoplastic immunotherapy: Secondary | ICD-10-CM | POA: Diagnosis not present

## 2020-06-14 DIAGNOSIS — Z5189 Encounter for other specified aftercare: Secondary | ICD-10-CM | POA: Diagnosis not present

## 2020-06-14 DIAGNOSIS — C8382 Other non-follicular lymphoma, intrathoracic lymph nodes: Secondary | ICD-10-CM

## 2020-06-14 DIAGNOSIS — Z79899 Other long term (current) drug therapy: Secondary | ICD-10-CM | POA: Diagnosis not present

## 2020-06-14 DIAGNOSIS — C8302 Small cell B-cell lymphoma, intrathoracic lymph nodes: Secondary | ICD-10-CM | POA: Diagnosis not present

## 2020-06-14 DIAGNOSIS — Z5111 Encounter for antineoplastic chemotherapy: Secondary | ICD-10-CM | POA: Diagnosis not present

## 2020-06-14 MED ORDER — ACETAMINOPHEN 325 MG PO TABS
ORAL_TABLET | ORAL | Status: AC
  Filled 2020-06-14: qty 2

## 2020-06-14 MED ORDER — ACETAMINOPHEN 325 MG PO TABS
650.0000 mg | ORAL_TABLET | Freq: Once | ORAL | Status: AC
  Administered 2020-06-14: 650 mg via ORAL

## 2020-06-14 MED ORDER — PALONOSETRON HCL INJECTION 0.25 MG/5ML
0.2500 mg | Freq: Once | INTRAVENOUS | Status: AC
  Administered 2020-06-14: 0.25 mg via INTRAVENOUS

## 2020-06-14 MED ORDER — SODIUM CHLORIDE 0.9 % IV SOLN
90.0000 mg/m2 | Freq: Once | INTRAVENOUS | Status: AC
  Administered 2020-06-14: 200 mg via INTRAVENOUS
  Filled 2020-06-14: qty 8

## 2020-06-14 MED ORDER — SODIUM CHLORIDE 0.9 % IV SOLN
10.0000 mg | Freq: Once | INTRAVENOUS | Status: AC
  Administered 2020-06-14: 10 mg via INTRAVENOUS
  Filled 2020-06-14: qty 10

## 2020-06-14 MED ORDER — SODIUM CHLORIDE 0.9 % IV SOLN
Freq: Once | INTRAVENOUS | Status: AC
  Filled 2020-06-14: qty 250

## 2020-06-14 MED ORDER — DIPHENHYDRAMINE HCL 50 MG/ML IJ SOLN
25.0000 mg | Freq: Once | INTRAMUSCULAR | Status: AC
  Administered 2020-06-14: 25 mg via INTRAVENOUS

## 2020-06-14 MED ORDER — PALONOSETRON HCL INJECTION 0.25 MG/5ML
INTRAVENOUS | Status: AC
  Filled 2020-06-14: qty 5

## 2020-06-14 MED ORDER — SODIUM CHLORIDE 0.9 % IV SOLN
375.0000 mg/m2 | Freq: Once | INTRAVENOUS | Status: AC
  Administered 2020-06-14: 800 mg via INTRAVENOUS
  Filled 2020-06-14: qty 50

## 2020-06-14 MED ORDER — DIPHENHYDRAMINE HCL 50 MG/ML IJ SOLN
INTRAMUSCULAR | Status: AC
  Filled 2020-06-14: qty 1

## 2020-06-14 MED ORDER — HEPARIN SOD (PORK) LOCK FLUSH 100 UNIT/ML IV SOLN
500.0000 [IU] | Freq: Once | INTRAVENOUS | Status: AC | PRN
  Administered 2020-06-14: 500 [IU]
  Filled 2020-06-14: qty 5

## 2020-06-14 NOTE — Patient Instructions (Signed)
[facility] Discharge Instructions for Patients Receiving Chemotherapy  Today you received the following chemotherapy agents Rituximab, Bendamustine  To help prevent nausea and vomiting after your treatment, we encourage you to take your nausea medication.   If you develop nausea and vomiting that is not controlled by your nausea medication, call the clinic.   BELOW ARE SYMPTOMS THAT SHOULD BE REPORTED IMMEDIATELY:  *FEVER GREATER THAN 100.5 F  *CHILLS WITH OR WITHOUT FEVER  NAUSEA AND VOMITING THAT IS NOT CONTROLLED WITH YOUR NAUSEA MEDICATION  *UNUSUAL SHORTNESS OF BREATH  *UNUSUAL BRUISING OR BLEEDING  TENDERNESS IN MOUTH AND THROAT WITH OR WITHOUT PRESENCE OF ULCERS  *URINARY PROBLEMS  *BOWEL PROBLEMS  UNUSUAL RASH Items with * indicate a potential emergency and should be followed up as soon as possible.  Feel free to call the clinic should you have any questions or concerns at The clinic phone number is [phone].  Please show the [place] at check-in to the Emergency Department and triage nurse.

## 2020-06-14 NOTE — Progress Notes (Signed)
Pt stable at time of discharge. 

## 2020-06-15 ENCOUNTER — Inpatient Hospital Stay: Payer: Medicare Other

## 2020-06-15 VITALS — BP 153/81 | HR 59 | Temp 97.8°F | Resp 18 | Ht 70.5 in | Wt 220.5 lb

## 2020-06-15 DIAGNOSIS — Z79899 Other long term (current) drug therapy: Secondary | ICD-10-CM | POA: Diagnosis not present

## 2020-06-15 DIAGNOSIS — C8382 Other non-follicular lymphoma, intrathoracic lymph nodes: Secondary | ICD-10-CM

## 2020-06-15 DIAGNOSIS — C8302 Small cell B-cell lymphoma, intrathoracic lymph nodes: Secondary | ICD-10-CM | POA: Diagnosis not present

## 2020-06-15 DIAGNOSIS — Z5189 Encounter for other specified aftercare: Secondary | ICD-10-CM | POA: Diagnosis not present

## 2020-06-15 DIAGNOSIS — Z5111 Encounter for antineoplastic chemotherapy: Secondary | ICD-10-CM | POA: Diagnosis not present

## 2020-06-15 DIAGNOSIS — Z5112 Encounter for antineoplastic immunotherapy: Secondary | ICD-10-CM | POA: Diagnosis not present

## 2020-06-15 MED ORDER — HEPARIN SOD (PORK) LOCK FLUSH 100 UNIT/ML IV SOLN
500.0000 [IU] | Freq: Once | INTRAVENOUS | Status: AC | PRN
  Administered 2020-06-15: 500 [IU]
  Filled 2020-06-15: qty 5

## 2020-06-15 MED ORDER — SODIUM CHLORIDE 0.9 % IV SOLN
10.0000 mg | Freq: Once | INTRAVENOUS | Status: AC
  Administered 2020-06-15: 10 mg via INTRAVENOUS
  Filled 2020-06-15: qty 10

## 2020-06-15 MED ORDER — SODIUM CHLORIDE 0.9 % IV SOLN
90.0000 mg/m2 | Freq: Once | INTRAVENOUS | Status: AC
  Administered 2020-06-15: 200 mg via INTRAVENOUS
  Filled 2020-06-15: qty 8

## 2020-06-15 MED ORDER — SODIUM CHLORIDE 0.9 % IV SOLN
Freq: Once | INTRAVENOUS | Status: AC
  Filled 2020-06-15: qty 250

## 2020-06-15 NOTE — Patient Instructions (Signed)
[facility] Discharge Instructions for Patients Receiving Chemotherapy  Today you received the following chemotherapy agents Bendamustine  To help prevent nausea and vomiting after your treatment, we encourage you to take your nausea medication.   If you develop nausea and vomiting that is not controlled by your nausea medication, call the clinic.   BELOW ARE SYMPTOMS THAT SHOULD BE REPORTED IMMEDIATELY:  *FEVER GREATER THAN 100.5 F  *CHILLS WITH OR WITHOUT FEVER  NAUSEA AND VOMITING THAT IS NOT CONTROLLED WITH YOUR NAUSEA MEDICATION  *UNUSUAL SHORTNESS OF BREATH  *UNUSUAL BRUISING OR BLEEDING  TENDERNESS IN MOUTH AND THROAT WITH OR WITHOUT PRESENCE OF ULCERS  *URINARY PROBLEMS  *BOWEL PROBLEMS  UNUSUAL RASH Items with * indicate a potential emergency and should be followed up as soon as possible.  Feel free to call the clinic should you have any questions or concerns at The clinic phone number is [phone].  Please show the [place] at check-in to the Emergency Department and triage nurse.

## 2020-06-15 NOTE — Progress Notes (Signed)
Pt stable at time of discharge. 

## 2020-06-16 ENCOUNTER — Other Ambulatory Visit: Payer: Self-pay

## 2020-06-16 ENCOUNTER — Inpatient Hospital Stay: Payer: Medicare Other

## 2020-06-16 VITALS — BP 155/76 | HR 51 | Temp 97.9°F | Resp 18 | Ht 70.5 in | Wt 221.2 lb

## 2020-06-16 DIAGNOSIS — C8382 Other non-follicular lymphoma, intrathoracic lymph nodes: Secondary | ICD-10-CM

## 2020-06-16 DIAGNOSIS — C8302 Small cell B-cell lymphoma, intrathoracic lymph nodes: Secondary | ICD-10-CM | POA: Diagnosis not present

## 2020-06-16 DIAGNOSIS — Z5189 Encounter for other specified aftercare: Secondary | ICD-10-CM | POA: Diagnosis not present

## 2020-06-16 DIAGNOSIS — Z79899 Other long term (current) drug therapy: Secondary | ICD-10-CM | POA: Diagnosis not present

## 2020-06-16 DIAGNOSIS — Z5112 Encounter for antineoplastic immunotherapy: Secondary | ICD-10-CM | POA: Diagnosis not present

## 2020-06-16 DIAGNOSIS — Z5111 Encounter for antineoplastic chemotherapy: Secondary | ICD-10-CM | POA: Diagnosis not present

## 2020-06-16 MED ORDER — PEGFILGRASTIM-BMEZ 6 MG/0.6ML ~~LOC~~ SOSY
PREFILLED_SYRINGE | SUBCUTANEOUS | Status: AC
  Filled 2020-06-16: qty 0.6

## 2020-06-16 MED ORDER — PEGFILGRASTIM-BMEZ 6 MG/0.6ML ~~LOC~~ SOSY
6.0000 mg | PREFILLED_SYRINGE | Freq: Once | SUBCUTANEOUS | Status: AC
  Administered 2020-06-16: 6 mg via SUBCUTANEOUS

## 2020-06-16 NOTE — Patient Instructions (Signed)

## 2020-06-16 NOTE — Progress Notes (Signed)
PT STABLE AT TIME OF DISCHARGE 

## 2020-06-28 ENCOUNTER — Ambulatory Visit (INDEPENDENT_AMBULATORY_CARE_PROVIDER_SITE_OTHER): Payer: Medicare Other

## 2020-06-28 ENCOUNTER — Other Ambulatory Visit: Payer: Self-pay

## 2020-06-28 DIAGNOSIS — Z23 Encounter for immunization: Secondary | ICD-10-CM | POA: Diagnosis not present

## 2020-06-28 DIAGNOSIS — R0602 Shortness of breath: Secondary | ICD-10-CM | POA: Diagnosis not present

## 2020-06-28 DIAGNOSIS — I420 Dilated cardiomyopathy: Secondary | ICD-10-CM

## 2020-06-28 DIAGNOSIS — J449 Chronic obstructive pulmonary disease, unspecified: Secondary | ICD-10-CM | POA: Diagnosis not present

## 2020-06-28 LAB — ECHOCARDIOGRAM COMPLETE
Area-P 1/2: 2.66 cm2
S' Lateral: 3.5 cm

## 2020-06-28 NOTE — Progress Notes (Signed)
Complete echocardiogram performed.  Jimmy Milla Wahlberg RDCS, RVT  

## 2020-07-11 NOTE — Progress Notes (Signed)
[facility]  [address] [place],  [place]  [address] [phone]  Clinic Day:  07/12/2020  Referring physician: [name], MD   HISTORY OF PRESENT ILLNESS:  The patient is a 64 y.o. male with marginal zone lymphoma, which was proven per a mediastinoscopy with lymph node biopsy in October 2020.  A bone marrow biopsy also showed a lymphohistiocytic infiltration that was worrisome for either his lymphoma or an immune response to it.  He comes in today to be evaluated before heading into his 5th cycle of bendamustine/Rituxan.  The patient claims to have tolerated his 4th cycle of treatment fairly well.  He denies having any B symptoms or peripheral lymphadenopathy which concerns him for progression of his marginal zone lymphoma while on treatment.   PHYSICAL EXAM:  Blood pressure (!) 159/96, pulse 83, temperature 97.9 F (36.6 C), resp. rate 18, height 5' 10.5" (1.791 m), weight 198 lb 14.4 oz (90.2 kg), SpO2 98 %. Wt Readings from Last 3 Encounters:  07/12/20 198 lb 14.4 oz (90.2 kg)  06/16/20 221 lb 4 oz (100.4 kg)  06/15/20 220 lb 8 oz (100 kg)   Body mass index is 28.14 kg/m. Performance status (ECOG): 1 Physical Exam Constitutional:      Appearance: Normal appearance. He is not ill-appearing.  HENT:     Mouth/Throat:     Mouth: Mucous membranes are moist.     Pharynx: Oropharynx is clear. No oropharyngeal exudate or posterior oropharyngeal erythema.  Cardiovascular:     Rate and Rhythm: Normal rate and regular rhythm.     Heart sounds: No murmur heard. No friction rub. No gallop.   Pulmonary:     Effort: Pulmonary effort is normal. No respiratory distress.     Breath sounds: Normal breath sounds. No wheezing, rhonchi or rales.  Chest:  Breasts:     Right: No axillary adenopathy or supraclavicular adenopathy.     Left: No axillary adenopathy or supraclavicular adenopathy.    Abdominal:     General: Bowel sounds are normal.  There is no distension.     Palpations: Abdomen is soft. There is no mass.     Tenderness: There is no abdominal tenderness.  Musculoskeletal:        General: No swelling.     Right lower leg: No edema.     Left lower leg: No edema.  Lymphadenopathy:     Cervical: No cervical adenopathy.     Upper Body:     Right upper body: No supraclavicular or axillary adenopathy.     Left upper body: No supraclavicular or axillary adenopathy.     Lower Body: No right inguinal adenopathy. No left inguinal adenopathy.  Skin:    General: Skin is warm.     Coloration: Skin is not jaundiced.     Findings: No lesion or rash.  Neurological:     General: No focal deficit present.     Mental Status: He is alert and oriented to person, place, and time. Mental status is at baseline.     Cranial Nerves: Cranial nerves are intact.  Psychiatric:        Mood and Affect: Mood normal.        Behavior: Behavior normal.        Thought Content: Thought content normal.   He looks thinner versus previous visits.  LABS:    Ref. Range 07/12/2020 00:00  Sodium Latest Ref Range: 137 - 147  140  Potassium  Latest Ref Range: 3.4 - 5.3  3.8  Chloride Latest Ref Range: 99 - 108  106  CO2 Latest Ref Range: 13 - 22  30 (A)  Glucose Unknown 110  BUN Latest Ref Range: 4 - 21  9  Creatinine Latest Ref Range: 0.6 - 1.3  0.9  Calcium Latest Ref Range: 8.7 - 10.7  9.4  Alkaline Phosphatase Latest Ref Range: 25 - 125  93  Albumin Latest Ref Range: 3.5 - 5.0  3.9  AST Latest Ref Range: 14 - 40  31  ALT Latest Ref Range: 10 - 40  17  Bilirubin, Total Unknown 0.7  WBC Unknown 2.9  RBC Latest Ref Range: 3.87 - 5.11  4.36  Hemoglobin Latest Ref Range: 13.5 - 17.5  13.5  HCT Latest Ref Range: 41 - 53  40 (A)  MCV Latest Ref Range: 76 - 111  91  Platelets Latest Ref Range: 150 - 399  180    ASSESSMENT & PLAN:  Assessment/Plan:  A 64 y.o. male with marginal zone lymphoma.  He will proceed with his 5th cycle of  bendamustine/Rituxan this week.  He will continue to receive Neulasta with each cycle of treatment to prevent severe neutropenia from developing .  Clinically, the patient appears to be doing fine. I will see him back in 4 weeks before he heads into his 6th and final cycle of treatment. The patient understands all the plans discussed today and is in agreement with them.      Afsheen Antony Macarthur Critchley, MD

## 2020-07-12 ENCOUNTER — Other Ambulatory Visit: Payer: Self-pay

## 2020-07-12 ENCOUNTER — Other Ambulatory Visit: Payer: Self-pay | Admitting: Oncology

## 2020-07-12 ENCOUNTER — Inpatient Hospital Stay: Payer: Medicare Other | Attending: Oncology

## 2020-07-12 ENCOUNTER — Inpatient Hospital Stay (INDEPENDENT_AMBULATORY_CARE_PROVIDER_SITE_OTHER): Payer: Medicare Other | Admitting: Oncology

## 2020-07-12 ENCOUNTER — Other Ambulatory Visit: Payer: Self-pay | Admitting: Hematology and Oncology

## 2020-07-12 ENCOUNTER — Telehealth: Payer: Self-pay | Admitting: Oncology

## 2020-07-12 VITALS — BP 159/96 | HR 83 | Temp 97.9°F | Resp 18 | Ht 70.5 in | Wt 198.9 lb

## 2020-07-12 DIAGNOSIS — Z5189 Encounter for other specified aftercare: Secondary | ICD-10-CM | POA: Insufficient documentation

## 2020-07-12 DIAGNOSIS — Z0001 Encounter for general adult medical examination with abnormal findings: Secondary | ICD-10-CM | POA: Diagnosis not present

## 2020-07-12 DIAGNOSIS — C8382 Other non-follicular lymphoma, intrathoracic lymph nodes: Secondary | ICD-10-CM

## 2020-07-12 DIAGNOSIS — Z79899 Other long term (current) drug therapy: Secondary | ICD-10-CM | POA: Insufficient documentation

## 2020-07-12 DIAGNOSIS — I2510 Atherosclerotic heart disease of native coronary artery without angina pectoris: Secondary | ICD-10-CM

## 2020-07-12 DIAGNOSIS — C8332 Diffuse large B-cell lymphoma, intrathoracic lymph nodes: Secondary | ICD-10-CM | POA: Diagnosis not present

## 2020-07-12 DIAGNOSIS — C8302 Small cell B-cell lymphoma, intrathoracic lymph nodes: Secondary | ICD-10-CM | POA: Insufficient documentation

## 2020-07-12 DIAGNOSIS — Z5111 Encounter for antineoplastic chemotherapy: Secondary | ICD-10-CM | POA: Insufficient documentation

## 2020-07-12 DIAGNOSIS — Z5112 Encounter for antineoplastic immunotherapy: Secondary | ICD-10-CM | POA: Insufficient documentation

## 2020-07-12 LAB — HEPATIC FUNCTION PANEL
ALT: 17 (ref 10–40)
AST: 31 (ref 14–40)
Alkaline Phosphatase: 93 (ref 25–125)
Bilirubin, Total: 0.7

## 2020-07-12 LAB — COMPREHENSIVE METABOLIC PANEL
Albumin: 3.9 (ref 3.5–5.0)
Calcium: 9.4 (ref 8.7–10.7)

## 2020-07-12 LAB — CBC
MCV: 91 (ref 76–111)
RBC: 4.36 (ref 3.87–5.11)

## 2020-07-12 LAB — BASIC METABOLIC PANEL
BUN: 9 (ref 4–21)
CO2: 30 — AB (ref 13–22)
Chloride: 106 (ref 99–108)
Creatinine: 0.9 (ref 0.6–1.3)
Glucose: 110
Potassium: 3.8 (ref 3.4–5.3)
Sodium: 140 (ref 137–147)

## 2020-07-12 LAB — CBC AND DIFFERENTIAL
HCT: 40 — AB (ref 41–53)
Hemoglobin: 13.5 (ref 13.5–17.5)
Neutrophils Absolute: 1.28
Platelets: 180 (ref 150–399)
WBC: 2.9

## 2020-07-12 NOTE — Telephone Encounter (Signed)
Per 12/15 los next appt given to patient 

## 2020-07-13 ENCOUNTER — Inpatient Hospital Stay: Payer: Medicare Other

## 2020-07-13 ENCOUNTER — Other Ambulatory Visit: Payer: Self-pay

## 2020-07-13 VITALS — BP 157/82 | HR 73 | Temp 98.3°F | Resp 16 | Ht 70.5 in | Wt 200.5 lb

## 2020-07-13 DIAGNOSIS — C8302 Small cell B-cell lymphoma, intrathoracic lymph nodes: Secondary | ICD-10-CM | POA: Diagnosis not present

## 2020-07-13 DIAGNOSIS — Z79899 Other long term (current) drug therapy: Secondary | ICD-10-CM | POA: Diagnosis not present

## 2020-07-13 DIAGNOSIS — Z5112 Encounter for antineoplastic immunotherapy: Secondary | ICD-10-CM | POA: Diagnosis not present

## 2020-07-13 DIAGNOSIS — C8382 Other non-follicular lymphoma, intrathoracic lymph nodes: Secondary | ICD-10-CM

## 2020-07-13 DIAGNOSIS — Z5111 Encounter for antineoplastic chemotherapy: Secondary | ICD-10-CM | POA: Diagnosis not present

## 2020-07-13 DIAGNOSIS — Z5189 Encounter for other specified aftercare: Secondary | ICD-10-CM | POA: Diagnosis not present

## 2020-07-13 MED ORDER — DIPHENHYDRAMINE HCL 50 MG/ML IJ SOLN
25.0000 mg | Freq: Once | INTRAMUSCULAR | Status: AC
  Administered 2020-07-13: 25 mg via INTRAVENOUS

## 2020-07-13 MED ORDER — SODIUM CHLORIDE 0.9 % IV SOLN
90.0000 mg/m2 | Freq: Once | INTRAVENOUS | Status: AC
  Administered 2020-07-13: 200 mg via INTRAVENOUS
  Filled 2020-07-13: qty 8

## 2020-07-13 MED ORDER — SODIUM CHLORIDE 0.9 % IV SOLN
Freq: Once | INTRAVENOUS | Status: AC
  Filled 2020-07-13: qty 250

## 2020-07-13 MED ORDER — ACETAMINOPHEN 325 MG PO TABS
ORAL_TABLET | ORAL | Status: AC
  Filled 2020-07-13: qty 2

## 2020-07-13 MED ORDER — HEPARIN SOD (PORK) LOCK FLUSH 100 UNIT/ML IV SOLN
500.0000 [IU] | Freq: Once | INTRAVENOUS | Status: AC | PRN
  Administered 2020-07-13: 500 [IU]
  Filled 2020-07-13: qty 5

## 2020-07-13 MED ORDER — SODIUM CHLORIDE 0.9 % IV SOLN
375.0000 mg/m2 | Freq: Once | INTRAVENOUS | Status: AC
  Administered 2020-07-13: 800 mg via INTRAVENOUS
  Filled 2020-07-13: qty 50

## 2020-07-13 MED ORDER — DIPHENHYDRAMINE HCL 50 MG/ML IJ SOLN
INTRAMUSCULAR | Status: AC
  Filled 2020-07-13: qty 1

## 2020-07-13 MED ORDER — DEXAMETHASONE SODIUM PHOSPHATE 100 MG/10ML IJ SOLN
10.0000 mg | Freq: Once | INTRAMUSCULAR | Status: AC
  Administered 2020-07-13: 10 mg via INTRAVENOUS
  Filled 2020-07-13: qty 10

## 2020-07-13 MED ORDER — ACETAMINOPHEN 325 MG PO TABS
650.0000 mg | ORAL_TABLET | Freq: Once | ORAL | Status: AC
  Administered 2020-07-13: 650 mg via ORAL

## 2020-07-13 MED ORDER — PALONOSETRON HCL INJECTION 0.25 MG/5ML
INTRAVENOUS | Status: AC
  Filled 2020-07-13: qty 5

## 2020-07-13 MED ORDER — PALONOSETRON HCL INJECTION 0.25 MG/5ML
0.2500 mg | Freq: Once | INTRAVENOUS | Status: AC
  Administered 2020-07-13: 0.25 mg via INTRAVENOUS

## 2020-07-13 NOTE — Patient Instructions (Signed)
[facility] Discharge Instructions for Patients Receiving Chemotherapy  Today you received the following chemotherapy agents Rituximab, Bendamustine  To help prevent nausea and vomiting after your treatment, we encourage you to take your nausea medication.   If you develop nausea and vomiting that is not controlled by your nausea medication, call the clinic.   BELOW ARE SYMPTOMS THAT SHOULD BE REPORTED IMMEDIATELY:  *FEVER GREATER THAN 100.5 F  *CHILLS WITH OR WITHOUT FEVER  NAUSEA AND VOMITING THAT IS NOT CONTROLLED WITH YOUR NAUSEA MEDICATION  *UNUSUAL SHORTNESS OF BREATH  *UNUSUAL BRUISING OR BLEEDING  TENDERNESS IN MOUTH AND THROAT WITH OR WITHOUT PRESENCE OF ULCERS  *URINARY PROBLEMS  *BOWEL PROBLEMS  UNUSUAL RASH Items with * indicate a potential emergency and should be followed up as soon as possible.  Feel free to call the clinic should you have any questions or concerns at The clinic phone number is [phone].  Please show the [facility] at check-in to the Emergency Department and triage nurse.

## 2020-07-14 ENCOUNTER — Inpatient Hospital Stay: Payer: Medicare Other

## 2020-07-14 VITALS — BP 134/84 | HR 70 | Temp 97.9°F | Resp 18 | Ht 70.5 in | Wt 203.2 lb

## 2020-07-14 DIAGNOSIS — C8302 Small cell B-cell lymphoma, intrathoracic lymph nodes: Secondary | ICD-10-CM | POA: Diagnosis not present

## 2020-07-14 DIAGNOSIS — Z5112 Encounter for antineoplastic immunotherapy: Secondary | ICD-10-CM | POA: Diagnosis not present

## 2020-07-14 DIAGNOSIS — Z5189 Encounter for other specified aftercare: Secondary | ICD-10-CM | POA: Diagnosis not present

## 2020-07-14 DIAGNOSIS — Z5111 Encounter for antineoplastic chemotherapy: Secondary | ICD-10-CM | POA: Diagnosis not present

## 2020-07-14 DIAGNOSIS — Z79899 Other long term (current) drug therapy: Secondary | ICD-10-CM | POA: Diagnosis not present

## 2020-07-14 DIAGNOSIS — C8382 Other non-follicular lymphoma, intrathoracic lymph nodes: Secondary | ICD-10-CM

## 2020-07-14 MED ORDER — SODIUM CHLORIDE 0.9 % IV SOLN
10.0000 mg | Freq: Once | INTRAVENOUS | Status: AC
  Administered 2020-07-14: 10 mg via INTRAVENOUS
  Filled 2020-07-14: qty 10

## 2020-07-14 MED ORDER — HEPARIN SOD (PORK) LOCK FLUSH 100 UNIT/ML IV SOLN
500.0000 [IU] | Freq: Once | INTRAVENOUS | Status: AC | PRN
  Administered 2020-07-14: 500 [IU]
  Filled 2020-07-14: qty 5

## 2020-07-14 MED ORDER — SODIUM CHLORIDE 0.9 % IV SOLN
90.0000 mg/m2 | Freq: Once | INTRAVENOUS | Status: AC
  Administered 2020-07-14: 200 mg via INTRAVENOUS
  Filled 2020-07-14: qty 8

## 2020-07-14 MED ORDER — SODIUM CHLORIDE 0.9 % IV SOLN
Freq: Once | INTRAVENOUS | Status: AC
  Filled 2020-07-14: qty 250

## 2020-07-14 NOTE — Patient Instructions (Signed)
[facility] Discharge Instructions for Patients Receiving Chemotherapy  Today you received the following chemotherapy agents Bendamustine  To help prevent nausea and vomiting after your treatment, we encourage you to take your nausea medication.   If you develop nausea and vomiting that is not controlled by your nausea medication, call the clinic.   BELOW ARE SYMPTOMS THAT SHOULD BE REPORTED IMMEDIATELY:  *FEVER GREATER THAN 100.5 F  *CHILLS WITH OR WITHOUT FEVER  NAUSEA AND VOMITING THAT IS NOT CONTROLLED WITH YOUR NAUSEA MEDICATION  *UNUSUAL SHORTNESS OF BREATH  *UNUSUAL BRUISING OR BLEEDING  TENDERNESS IN MOUTH AND THROAT WITH OR WITHOUT PRESENCE OF ULCERS  *URINARY PROBLEMS  *BOWEL PROBLEMS  UNUSUAL RASH Items with * indicate a potential emergency and should be followed up as soon as possible.  Feel free to call the clinic should you have any questions or concerns at The clinic phone number is [phone].  Please show the [facility] at check-in to the Emergency Department and triage nurse.

## 2020-07-17 ENCOUNTER — Inpatient Hospital Stay: Payer: Medicare Other

## 2020-07-17 ENCOUNTER — Other Ambulatory Visit: Payer: Self-pay

## 2020-07-17 VITALS — BP 148/87 | HR 91 | Temp 97.8°F | Resp 18 | Ht 70.5 in | Wt 196.4 lb

## 2020-07-17 DIAGNOSIS — C8302 Small cell B-cell lymphoma, intrathoracic lymph nodes: Secondary | ICD-10-CM | POA: Diagnosis not present

## 2020-07-17 DIAGNOSIS — Z79899 Other long term (current) drug therapy: Secondary | ICD-10-CM | POA: Diagnosis not present

## 2020-07-17 DIAGNOSIS — C8382 Other non-follicular lymphoma, intrathoracic lymph nodes: Secondary | ICD-10-CM

## 2020-07-17 DIAGNOSIS — Z5189 Encounter for other specified aftercare: Secondary | ICD-10-CM | POA: Diagnosis not present

## 2020-07-17 DIAGNOSIS — Z5112 Encounter for antineoplastic immunotherapy: Secondary | ICD-10-CM | POA: Diagnosis not present

## 2020-07-17 DIAGNOSIS — Z5111 Encounter for antineoplastic chemotherapy: Secondary | ICD-10-CM | POA: Diagnosis not present

## 2020-07-17 MED ORDER — PEGFILGRASTIM-BMEZ 6 MG/0.6ML ~~LOC~~ SOSY
6.0000 mg | PREFILLED_SYRINGE | Freq: Once | SUBCUTANEOUS | Status: AC
  Administered 2020-07-17: 6 mg via SUBCUTANEOUS

## 2020-07-17 MED ORDER — PEGFILGRASTIM-BMEZ 6 MG/0.6ML ~~LOC~~ SOSY
PREFILLED_SYRINGE | SUBCUTANEOUS | Status: AC
  Filled 2020-07-17: qty 0.6

## 2020-07-17 NOTE — Patient Instructions (Signed)

## 2020-07-24 DIAGNOSIS — Z6827 Body mass index (BMI) 27.0-27.9, adult: Secondary | ICD-10-CM | POA: Diagnosis not present

## 2020-07-24 DIAGNOSIS — F17200 Nicotine dependence, unspecified, uncomplicated: Secondary | ICD-10-CM | POA: Diagnosis not present

## 2020-07-24 DIAGNOSIS — M5431 Sciatica, right side: Secondary | ICD-10-CM | POA: Diagnosis not present

## 2020-08-02 DIAGNOSIS — J329 Chronic sinusitis, unspecified: Secondary | ICD-10-CM | POA: Diagnosis not present

## 2020-08-04 ENCOUNTER — Telehealth: Payer: Self-pay | Admitting: Oncology

## 2020-08-04 NOTE — Telephone Encounter (Signed)
Called patient to rescheduled 1/13 Infusion Appt time from 9:30 am to 1:30 pm.  Ok per Ms Martinique

## 2020-08-07 DIAGNOSIS — R509 Fever, unspecified: Secondary | ICD-10-CM | POA: Diagnosis not present

## 2020-08-07 DIAGNOSIS — J449 Chronic obstructive pulmonary disease, unspecified: Secondary | ICD-10-CM | POA: Diagnosis not present

## 2020-08-07 NOTE — Progress Notes (Signed)
[facility]  [address] [place],  [place]  [address] [phone]  Clinic Day:  08/08/2020  Referring physician: [name], MD   CHIEF COMPLAINT:  CC:   Marginal zone lymphoma  Current Treatment:   Bendamustine/rituximab   HISTORY OF PRESENT ILLNESS:  Antonio Benson is a 65 y.o. male with marginal zone lymphoma, which was proven per a mediastinoscopy with lymph node biopsy in October 2020.  A bone marrow biopsy also showed a lymphohistiocytic infiltration that was worrisome for either his lymphoma or an immune response to it.  He is currently receiving bendamustine/rituximab and has completed 5 cycles.  INTERVAL HISTORY:  [name] is here today prior to his 6th cycle of bendamustine/Rituxan. He states he has not been doing well since his last treatment, mainly due to general weakness and decreased appetite. He started having fevers of 101 to 102 on January 4th after being exposed to [place] on December 25th. He saw Dr. Lin Landsman and COVID testing was negative.  He otherwise did not have symptoms of infection.  Dr. Lin Landsman placed him on doxycycline 128m twice daily and cefdinir 3028mtwice daily.  He had persistent fever, so his wife took him to see his pulmonologist yesterday.  Apparently, a chest x-ray was normal and flu test was negative.  He reports an episode of diaphoresis in the early morning hours today.  He has chronic lung disease, but denies increased dyspnea or cough.  His pulse oximetry at home has been running between 93 and 98. He has noticed his pulse is running 110-120 when he had the fever. He reports low back pain since Thanksgiving, which has been progressing.  He saw Dr. ReLin Landsmannd had a  Lumbar spine x-ray in the last few weeks, which apparently did not show significant abnormality.  He was given hydrocodone/APAP 5/325 to use as needed and this did help his pain, but he has used up that prescription.  He is currently use and  Tylenol and aspirin with some relief. More recently, he has noted right lower extremity weakness and has been having falls.  He is in a wheelchair today. His weight has decreased 11 pounds over last 4 weeks.  REVIEW OF SYSTEMS:  Review of Systems  Constitutional: Positive for appetite change, chills (x 1 week), diaphoresis, fatigue, fever (101-102 times 1 week) and unexpected weight change.  HENT:   Negative for lump/mass, mouth sores and sore throat.   Respiratory: Positive for cough (occasional dry cough), shortness of breath (stable dyspnea) and wheezing (occasional wheezing).   Cardiovascular: Negative for chest pain and leg swelling.  Gastrointestinal: Negative for abdominal pain, constipation, diarrhea, nausea and vomiting.  Genitourinary: Negative for difficulty urinating, dysuria, frequency and hematuria.   Musculoskeletal: Positive for back pain (times 2 months, bu worsening) and gait problem (due to right leg weakness).  Skin: Negative for rash.  Neurological: Positive for extremity weakness (right lower extremity), gait problem (due to right leg weakness) and numbness (right lower extremity). Negative for dizziness and headaches.  Hematological: Negative for adenopathy.  Psychiatric/Behavioral: Negative for depression. The patient is not nervous/anxious.      VITALS:  Blood pressure 107/68, pulse 82, temperature 97.6 F (36.4 C), resp. rate 16, height 5' 10.5" (1.791 m), weight 187 lb 4.8 oz (85 kg), SpO2 96 %.  Wt Readings from Last 3 Encounters:  08/08/20 187 lb 4.8 oz (85 kg)  07/17/20 196 lb 6.4 oz (89.1 kg)  07/14/20 203 lb 4  oz (92.2 kg)    Body mass index is 26.49 kg/m.  Performance status (ECOG): 2 - Symptomatic, <50% confined to bed  PHYSICAL EXAM:  Physical Exam Vitals and nursing note reviewed.  Constitutional:      General: He is not in acute distress.    Appearance: Normal appearance. He is normal weight. He is not toxic-appearing.  HENT:     Head:  Normocephalic and atraumatic.     Mouth/Throat:     Mouth: Mucous membranes are moist.     Pharynx: Oropharynx is clear.  Eyes:     General: No scleral icterus.    Extraocular Movements: Extraocular movements intact.     Conjunctiva/sclera: Conjunctivae normal.     Pupils: Pupils are equal, round, and reactive to light.  Cardiovascular:     Rate and Rhythm: Normal rate and regular rhythm.     Heart sounds: Normal heart sounds. No murmur heard. No friction rub. No gallop.   Pulmonary:     Effort: Pulmonary effort is normal. No respiratory distress.     Breath sounds: No stridor. Wheezing (scattered end inspiratory wheezing) present. No rhonchi or rales.  Chest:  Breasts:     Right: No axillary adenopathy or supraclavicular adenopathy.     Left: No axillary adenopathy or supraclavicular adenopathy.    Abdominal:     General: There is no distension.     Palpations: Abdomen is soft. There is no hepatomegaly, splenomegaly or mass.     Tenderness: There is no abdominal tenderness. There is no guarding.     Hernia: No hernia is present. There is no hernia in the left inguinal area or right inguinal area.  Musculoskeletal:     Cervical back: Normal range of motion and neck supple.     Right lower leg: No edema.     Left lower leg: No edema.  Lymphadenopathy:     Cervical: No cervical adenopathy.     Upper Body:     Right upper body: No supraclavicular or axillary adenopathy.     Left upper body: No supraclavicular or axillary adenopathy.     Lower Body: No right inguinal adenopathy. No left inguinal adenopathy.  Skin:    General: Skin is warm and dry.     Coloration: Skin is not jaundiced.     Findings: No rash.  Neurological:     General: No focal deficit present.     Mental Status: He is alert and oriented to person, place, and time.     Cranial Nerves: Cranial nerves are intact. No cranial nerve deficit.     Sensory: Sensation is intact. No sensory deficit.     Motor:  Weakness (proximal right lower extremity 3+/5) present.     Coordination: Coordination is intact.  Psychiatric:        Mood and Affect: Mood normal.        Behavior: Behavior normal.        Thought Content: Thought content normal.    LABS:   CBC Latest Ref Rng & Units 08/08/2020 07/12/2020 06/12/2020  WBC - 1.9 2.9 2.2(L)  Hemoglobin 13.5 - 17.5 12.7(A) 13.5 11.9(L)  Hematocrit 41 - 53 37(A) 40(A) 37.8(L)  Platelets 150 - 399 84(A) 180 142(L)   CMP Latest Ref Rng & Units 08/08/2020 07/12/2020 06/12/2020  Glucose 70 - 99 mg/dL - - 84  BUN 4 - _0 5(L)  Creatinine 0.6 - 1.3 1.0 0.9 0.96  Sodium 137 - 147 136(A) 140 144  Potassium 3.4 - 5.3 3.2(A) 3.8 3.5  Chloride 99 - 108 102 106 108  CO2 13 - 22 28(A) 30(A) 28  Calcium 8.7 - 10.7 9.1 9.4 8.7(L)  Total Protein 6.5 - 8.1 g/dL - - 5.9(L)  Total Bilirubin 0.3 - 1.2 mg/dL - - 0.6  Alkaline Phos 25 - 125 82 93 83  AST 14 - 40 34 31 26  ALT 10 - 40 _0 No results found for: CEA1 / No results found for: CEA1 No results found for: PSA1 No results found for: BSJ628 No results found for: CAN125  No results found for: TOTALPROTELP, ALBUMINELP, A1GS, A2GS, BETS, BETA2SER, GAMS, MSPIKE, SPEI No results found for: TIBC, FERRITIN, IRONPCTSAT No results found for: LDH  STUDIES:  No results found.    HISTORY:   Past Medical History:  Diagnosis Date   Anemia    low iron   Arthritis    COPD (chronic obstructive pulmonary disease) (HCC)    Coronary artery disease    1 stent   GERD (gastroesophageal reflux disease)    Headache    Hyperlipidemia    Hypertension    Myocardial infarction ([facility])    2015 or 2016   Pneumonia    Sinus trouble    Sleep apnea    no longer using Cpap due to weight loss    Past Surgical History:  Procedure Laterality Date   CARDIAC CATHETERIZATION     CORONARY ANGIOPLASTY     KNEE SURGERY Left    arthroscopy - meniscus tear   MEDIASTINOSCOPY N/A 05/24/2019    Procedure: MEDIASTINOSCOPY;  Surgeon: Melrose Nakayama, MD;  [facility];  Service: Thoracic;  Laterality: N/A;   NASAL SINUS SURGERY  1990's   deviated septum    TONSILLECTOMY      Family History  Problem Relation Age of Onset   Asthma Mother    Aneurysm Father     Social History:  reports that he has been smoking cigarettes. He has a 40.00 pack-year smoking history. He has never used smokeless tobacco. He reports that he does not drink alcohol and does not use drugs.The patient is accompanied by his wife today.  Allergies: No Known Allergies  Current Medications: Current Outpatient Medications  Medication Sig Dispense Refill   HYDROcodone-acetaminophen (NORCO/VICODIN) 5-325 MG tablet Take 1 tablet by mouth every 6 (six) hours as needed for moderate pain. 30 tablet 0   potassium chloride (KLOR-CON) 10 MEQ tablet Take 2 tablets (20 mEq total) by mouth daily. 14 tablet 0   albuterol (PROVENTIL HFA;VENTOLIN HFA) 108 (90 BASE) MCG/ACT inhaler Inhale 2 puffs into the lungs every 6 (six) hours as needed for wheezing or shortness of breath.     albuterol (PROVENTIL) (2.5 MG/3ML) 0.083% nebulizer solution Take 3 mLs (2.5 mg total) by nebulization every 6 (six) hours as needed for wheezing or shortness of breath. 120 mL 12   aspirin 81 MG tablet Take 81 mg by mouth daily.     atorvastatin (LIPITOR) 80 MG tablet TAKE 1 TABLET BY MOUTH DAILY AT 6 PM. 90 tablet 2   carvedilol (COREG) 3.125 MG tablet TAKE 1 TABLET (3.125 MG TOTAL) BY MOUTH 2 (TWO) TIMES DAILY. 180 tablet 2   ferrous sulfate 325 (65 FE) MG tablet Take 325 mg by mouth daily.     ipratropium-albuterol (DUONEB) 0.5-2.5 (3) MG/3ML SOLN SMARTSIG:1 Vial(s) Via Nebulizer Every 4-6 Hours PRN     Multiple Vitamin (MULTIVITAMIN WITH MINERALS) TABS  tablet Take 1 tablet by mouth daily.     nitroGLYCERIN (NITROSTAT) 0.4 MG SL tablet PLACE 1 TABLET (0.4 MG TOTAL) UNDER THE TONGUE EVERY 5 (FIVE) MINUTES AS NEEDED FOR CHEST  PAIN. 75 tablet 3   ondansetron (ZOFRAN) 4 MG tablet Take 4 mg by mouth every 8 (eight) hours as needed for nausea or vomiting.     prochlorperazine (COMPAZINE) 10 MG tablet Take 10 mg by mouth every 6 (six) hours as needed for nausea or vomiting.     sodium chloride (OCEAN) 0.65 % SOLN nasal spray Place 1 spray into both nostrils as needed for congestion.     TRELEGY ELLIPTA 100-62.5-25 MCG/INH AEPB Inhale 1 puff into the lungs daily.     No current facility-administered medications for this visit.     ASSESSMENT & PLAN:   AssessmentPlan:   1. Marginal zone lymphoma.  He had difficulty tolerating his last cycle of bendamustine/rituximab mainly due to fatigue, weakness and decreased appetite.  He has pancytopenia likely secondary to treatment, but with improvement in the neutropenia.  As he is doing poorly with multiple B-symptoms, I will hold his 6th cycle of chemotherapy and obtain CT chest, abdomen and pelvis to reassess his lymphoma. 2. Fever of unknown origin despite antibiotics.  I will obtain blood cultures and urine culture. 3. Low back pain with new onset right lower extremity weakness and falling. I will obtain a stat MRI lumbar spine for immediate evaluation. I will prescribe additional hydrocodone/APAP 3/325, so he can undergo evaluation. 4. Mild hypokalemia. I will place him on potassium chloride 20 meq daily for 7 days.  I will contact him with the results of his additional labs, as well as MRI.  I will plan to see him back in 1 week with a CBC and comprehensive metabolic panel for the results of the CT imaging.  The patient and his wife understand the plans discussed today and is in agreement with them.    They know to contact our office if he develops concerns prior to his next appointment.    I provided 40 minutes of face-to-face time during this this encounter and > 50% was spent counseling as documented under my assessment and plan.     Marvia Pickles, PA-C

## 2020-08-08 ENCOUNTER — Encounter: Payer: Self-pay | Admitting: Hematology and Oncology

## 2020-08-08 ENCOUNTER — Encounter: Payer: Self-pay | Admitting: Oncology

## 2020-08-08 ENCOUNTER — Inpatient Hospital Stay (INDEPENDENT_AMBULATORY_CARE_PROVIDER_SITE_OTHER): Payer: Medicare Other | Admitting: Hematology and Oncology

## 2020-08-08 ENCOUNTER — Inpatient Hospital Stay: Payer: Medicare Other | Attending: Oncology

## 2020-08-08 ENCOUNTER — Other Ambulatory Visit: Payer: Self-pay

## 2020-08-08 ENCOUNTER — Telehealth: Payer: Self-pay | Admitting: Hematology and Oncology

## 2020-08-08 VITALS — BP 107/68 | HR 82 | Temp 97.6°F | Resp 16 | Ht 70.5 in | Wt 187.3 lb

## 2020-08-08 DIAGNOSIS — R509 Fever, unspecified: Secondary | ICD-10-CM

## 2020-08-08 DIAGNOSIS — C8382 Other non-follicular lymphoma, intrathoracic lymph nodes: Secondary | ICD-10-CM

## 2020-08-08 DIAGNOSIS — R300 Dysuria: Secondary | ICD-10-CM | POA: Diagnosis not present

## 2020-08-08 DIAGNOSIS — R29898 Other symptoms and signs involving the musculoskeletal system: Secondary | ICD-10-CM | POA: Diagnosis not present

## 2020-08-08 DIAGNOSIS — D696 Thrombocytopenia, unspecified: Secondary | ICD-10-CM | POA: Diagnosis not present

## 2020-08-08 DIAGNOSIS — M545 Low back pain, unspecified: Secondary | ICD-10-CM | POA: Diagnosis not present

## 2020-08-08 DIAGNOSIS — E328 Other diseases of thymus: Secondary | ICD-10-CM | POA: Diagnosis not present

## 2020-08-08 LAB — CBC
Absolute Lymphocytes: 0.08 — AB (ref 0.65–4.75)
MCV: 90 (ref 80–94)
RBC: 4.13 (ref 3.87–5.11)

## 2020-08-08 LAB — HEPATIC FUNCTION PANEL
ALT: 22 (ref 10–40)
AST: 34 (ref 14–40)
Alkaline Phosphatase: 82 (ref 25–125)
Bilirubin, Total: 0.9

## 2020-08-08 LAB — BASIC METABOLIC PANEL
BUN: 16 (ref 4–21)
CO2: 28 — AB (ref 13–22)
Chloride: 102 (ref 99–108)
Creatinine: 1 (ref 0.6–1.3)
Glucose: 111
Potassium: 3.2 — AB (ref 3.4–5.3)
Sodium: 136 — AB (ref 137–147)

## 2020-08-08 LAB — COMPREHENSIVE METABOLIC PANEL
Albumin: 3.6 (ref 3.5–5.0)
Calcium: 9.1 (ref 8.7–10.7)

## 2020-08-08 LAB — CBC AND DIFFERENTIAL
HCT: 37 — AB (ref 41–53)
Hemoglobin: 12.7 — AB (ref 13.5–17.5)
Neutrophils Absolute: 1.52
Platelets: 84 — AB (ref 150–399)
WBC: 1.9

## 2020-08-08 LAB — VITAMIN B12: Vitamin B-12: >1000

## 2020-08-08 MED ORDER — HYDROCODONE-ACETAMINOPHEN 5-325 MG PO TABS: 1.0000 | ORAL_TABLET | Freq: Four times a day (QID) | ORAL | 0 refills | Status: AC | PRN

## 2020-08-08 MED ORDER — POTASSIUM CHLORIDE ER 10 MEQ PO TBCR: 20.0000 meq | EXTENDED_RELEASE_TABLET | Freq: Every day | ORAL | 0 refills | Status: DC

## 2020-08-08 NOTE — Telephone Encounter (Signed)
Per 1/11los next appt sched and given to wife

## 2020-08-09 ENCOUNTER — Other Ambulatory Visit: Payer: Medicare Other

## 2020-08-09 ENCOUNTER — Ambulatory Visit: Payer: Medicare Other | Admitting: Oncology

## 2020-08-09 ENCOUNTER — Encounter: Payer: Self-pay | Admitting: Hematology and Oncology

## 2020-08-09 ENCOUNTER — Inpatient Hospital Stay: Payer: Medicare Other

## 2020-08-09 DIAGNOSIS — J432 Centrilobular emphysema: Secondary | ICD-10-CM | POA: Diagnosis not present

## 2020-08-09 DIAGNOSIS — C8510 Unspecified B-cell lymphoma, unspecified site: Secondary | ICD-10-CM | POA: Diagnosis not present

## 2020-08-09 DIAGNOSIS — I2510 Atherosclerotic heart disease of native coronary artery without angina pectoris: Secondary | ICD-10-CM | POA: Diagnosis not present

## 2020-08-09 DIAGNOSIS — M47814 Spondylosis without myelopathy or radiculopathy, thoracic region: Secondary | ICD-10-CM | POA: Diagnosis not present

## 2020-08-09 DIAGNOSIS — D7389 Other diseases of spleen: Secondary | ICD-10-CM | POA: Diagnosis not present

## 2020-08-09 DIAGNOSIS — K449 Diaphragmatic hernia without obstruction or gangrene: Secondary | ICD-10-CM | POA: Diagnosis not present

## 2020-08-09 DIAGNOSIS — C8382 Other non-follicular lymphoma, intrathoracic lymph nodes: Secondary | ICD-10-CM | POA: Diagnosis not present

## 2020-08-10 ENCOUNTER — Other Ambulatory Visit: Payer: Self-pay | Admitting: Hematology and Oncology

## 2020-08-10 ENCOUNTER — Ambulatory Visit: Payer: Medicare Other

## 2020-08-10 ENCOUNTER — Inpatient Hospital Stay: Payer: Medicare Other

## 2020-08-10 MED ORDER — AZITHROMYCIN 500 MG PO TABS: 500.0000 mg | ORAL_TABLET | Freq: Every day | ORAL | 0 refills | Status: DC

## 2020-08-11 ENCOUNTER — Inpatient Hospital Stay: Payer: Medicare Other

## 2020-08-11 ENCOUNTER — Other Ambulatory Visit: Payer: Self-pay | Admitting: Hematology and Oncology

## 2020-08-11 DIAGNOSIS — C8382 Other non-follicular lymphoma, intrathoracic lymph nodes: Secondary | ICD-10-CM

## 2020-08-11 MED ORDER — AZITHROMYCIN 500 MG PO TABS: 500.0000 mg | ORAL_TABLET | Freq: Every day | ORAL | 0 refills | Status: DC

## 2020-08-11 MED ORDER — FLUCONAZOLE 200 MG PO TABS: 200.0000 mg | ORAL_TABLET | Freq: Every day | ORAL | 0 refills | Status: DC

## 2020-08-11 NOTE — Progress Notes (Signed)
The patient has progressive disease on recent CT chest, abdomen and pelvis.  There is a nonspecific ground-glass appearance in the lung, which may represent an atypical infection.  I therefore place the patient on azithromycin 500 mg daily for 7 days. He also has degenerative disease in the lumbar spine causing his back pain and right leg weakness.  I discussed his case with Dr. Bobby Rumpf, who then spoke with the interventional radiologist who states they can biopsy 1 of the para-aortic lymph nodes.  Dr. Bobby Rumpf also recommended placing the patient on fluconazole.  I contacted the patient's wife and discussed the recommendations with her and she is in agreement.  I will schedule the lymph node biopsy as soon as possible.  He will keep his appointment with me on 01/18 as previously scheduled to see if he is improving with the additional antibiotic and antifungal.

## 2020-08-14 ENCOUNTER — Other Ambulatory Visit: Payer: Self-pay | Admitting: Hematology and Oncology

## 2020-08-14 DIAGNOSIS — Z01818 Encounter for other preprocedural examination: Secondary | ICD-10-CM

## 2020-08-14 NOTE — Progress Notes (Deleted)
[facility]  [address] [place],  [place]  [address] [phone]  Clinic Day:  08/14/2020  Referring physician: [name], MD   CHIEF COMPLAINT:  CC:   Marginal zone lymphoma  Current Treatment:   Bendamustine/rituximab   HISTORY OF PRESENT ILLNESS:  Antonio Benson is a 65 y.o. male with marginal zone lymphoma, which was proven per a mediastinoscopy with lymph node biopsy in October 2020.  A bone marrow biopsy also showed a lymphohistiocytic infiltration that was worrisome for either his lymphoma or an immune response to it.  He is currently receiving bendamustine/rituximab and has completed 5 cycles. He was seen last week prior to a 6th cycle of bendamustine/rituximab and reported fevers for a week despite doxycycline and cefdinir.  COVID and flu testing were negative. A chest x-ray was negative at his pulmonologist's office.  He reported decreased appetite, fatigue, chills and sweats.  He had lost 11 pounds.  He also reported right lower extremity weakness with falls.  Due to the persistent symptoms despite antibiotic treatment further evaluation was recommended.  Urine and blood cultures were negative.  CT chest, abdomen and pelvis revealed mild progression of disease with ***  INTERVAL HISTORY:  Antonio Benson is here today prior for continued supportive care. He denies continued fevers, chills or night sweats.  He reports persistent low back pain and right leg weakness.  His pain is controlled with hydrocodone/APAP as needed.  His appetite remains decreased. His weight has decreased 11 pounds over last 4 weeks.  REVIEW OF SYSTEMS:  Review of Systems - Oncology   VITALS:  There were no vitals taken for this visit.  Wt Readings from Last 3 Encounters:  08/08/20 187 lb 4.8 oz (85 kg)  07/17/20 196 lb 6.4 oz (89.1 kg)  07/14/20 203 lb 4 oz (92.2 kg)    There is no height or weight on file to calculate BMI.  Performance status (ECOG): 2  - Symptomatic, <50% confined to bed  PHYSICAL EXAM:  Physical Exam LABS:   CBC Latest Ref Rng & Units 08/08/2020 07/12/2020 06/12/2020  WBC - 1.9 2.9 2.2(L)  Hemoglobin 13.5 - 17.5 12.7(A) 13.5 11.9(L)  Hematocrit 41 - 53 37(A) 40(A) 37.8(L)  Platelets 150 - 399 84(A) 180 142(L)   CMP Latest Ref Rng & Units 08/08/2020 07/12/2020 06/12/2020  Glucose 70 - 99 mg/dL - - 84  BUN 4 - _0 5(L)  Creatinine 0.6 - 1.3 1.0 0.9 0.96  Sodium 137 - 147 136(A) 140 144  Potassium 3.4 - 5.3 3.2(A) 3.8 3.5  Chloride 99 - 108 102 106 108  CO2 13 - 22 28(A) 30(A) 28  Calcium 8.7 - 10.7 9.1 9.4 8.7(L)  Total Protein 6.5 - 8.1 g/dL - - 5.9(L)  Total Bilirubin 0.3 - 1.2 mg/dL - - 0.6  Alkaline Phos 25 - 125 82 93 83  AST 14 - 40 34 31 26  ALT 10 - 40 _1 No results found for: CEA1 / No results found for: CEA1 No results found for: PSA1 No results found for: WIO973 No results found for: ZHG992  No results found for: TOTALPROTELP, ALBUMINELP, A1GS, A2GS, BETS, BETA2SER, GAMS, MSPIKE, SPEI No results found for: TIBC, FERRITIN, IRONPCTSAT No results found for: LDH  STUDIES:  ***  HISTORY:   Past Medical History:  Diagnosis Date  . Anemia    low iron  . Arthritis   . COPD (chronic obstructive  pulmonary disease) ([place])   . Coronary artery disease    1 stent  . GERD (gastroesophageal reflux disease)   . Headache   . Hyperlipidemia   . Hypertension   . Myocardial infarction ([place])    2015 or 2016  . Pneumonia   . Sinus trouble   . Sleep apnea    no longer using Cpap due to weight loss    Past Surgical History:  Procedure Laterality Date  . CARDIAC CATHETERIZATION    . CORONARY ANGIOPLASTY    . KNEE SURGERY Left    arthroscopy - meniscus tear  . MEDIASTINOSCOPY N/A 05/24/2019   Procedure: MEDIASTINOSCOPY;  Surgeon: Melrose Nakayama, MD;  Location: [facility] OR;  Service: Thoracic;  Laterality: N/A;  . NASAL SINUS SURGERY  1990's   deviated septum   . TONSILLECTOMY       Family History  Problem Relation Age of Onset  . Asthma Mother   . Aneurysm Father     Social History:  reports that he has been smoking cigarettes. He has a 40.00 pack-year smoking history. He has never used smokeless tobacco. He reports that he does not drink alcohol and does not use drugs.The patient is accompanied by his wife today.  Allergies: No Known Allergies  Current Medications: Current Outpatient Medications  Medication Sig Dispense Refill  . albuterol (PROVENTIL HFA;VENTOLIN HFA) 108 (90 BASE) MCG/ACT inhaler Inhale 2 puffs into the lungs every 6 (six) hours as needed for wheezing or shortness of breath.    Marland Kitchen albuterol (PROVENTIL) (2.5 MG/3ML) 0.083% nebulizer solution Take 3 mLs (2.5 mg total) by nebulization every 6 (six) hours as needed for wheezing or shortness of breath. 120 mL 12  . aspirin 81 MG tablet Take 81 mg by mouth daily.    Marland Kitchen atorvastatin (LIPITOR) 80 MG tablet TAKE 1 TABLET BY MOUTH DAILY AT 6 PM. 90 tablet 2  . azithromycin (ZITHROMAX) 500 MG tablet Take 1 tablet (500 mg total) by mouth daily. 7 tablet 0  . carvedilol (COREG) 3.125 MG tablet TAKE 1 TABLET (3.125 MG TOTAL) BY MOUTH 2 (TWO) TIMES DAILY. 180 tablet 2  . ferrous sulfate 325 (65 FE) MG tablet Take 325 mg by mouth daily.    . fluconazole (DIFLUCAN) 200 MG tablet Take 1 tablet (200 mg total) by mouth daily. 7 tablet 0  . HYDROcodone-acetaminophen (NORCO/VICODIN) 5-325 MG tablet Take 1 tablet by mouth every 6 (six) hours as needed for moderate pain. 30 tablet 0  . ipratropium-albuterol (DUONEB) 0.5-2.5 (3) MG/3ML SOLN SMARTSIG:1 Vial(s) Via Nebulizer Every 4-6 Hours PRN    . Multiple Vitamin (MULTIVITAMIN WITH MINERALS) TABS tablet Take 1 tablet by mouth daily.    . nitroGLYCERIN (NITROSTAT) 0.4 MG SL tablet PLACE 1 TABLET (0.4 MG TOTAL) UNDER THE TONGUE EVERY 5 (FIVE) MINUTES AS NEEDED FOR CHEST PAIN. 75 tablet 3  . ondansetron (ZOFRAN) 4 MG tablet Take 4 mg by mouth every 8 (eight) hours as  needed for nausea or vomiting.    . potassium chloride (KLOR-CON) 10 MEQ tablet Take 2 tablets (20 mEq total) by mouth daily. 14 tablet 0  . prochlorperazine (COMPAZINE) 10 MG tablet Take 10 mg by mouth every 6 (six) hours as needed for nausea or vomiting.    . sodium chloride (OCEAN) 0.65 % SOLN nasal spray Place 1 spray into both nostrils as needed for congestion.    . TRELEGY ELLIPTA 100-62.5-25 MCG/INH AEPB Inhale 1 puff into the lungs daily.     No current  facility-administered medications for this visit.     ASSESSMENT & PLAN:   AssessmentPlan:   1. Marginal zone lymphoma.  He had difficulty tolerating his last cycle of bendamustine/rituximab mainly due to fatigue, weakness and decreased appetite. Evaluation revealed progressive disease despite continuing chemotherapy.  This is concerning for possible transformation of his lymphoma.  Dr. Bobby Rumpf recommends repeat biopsy and after talking to the interventional radiologist, he recommends para-aortic lymph node biopsy. 2. Fever of unknown origin despite antibiotics. Bood and urine cultures were negative. 3. Low back pain with new onset right lower extremity weakness and falling. MRI lumbar spine revealed ***. 4. Mild hypokalemia, resolved with potassium chloride 20 meq daily for 7 days.  The patient and his wife understand the plans discussed today and is in agreement with them.    They know to contact our office if he develops concerns prior to his next appointment.    I provided *** minutes of face-to-face time during this this encounter and > 50% was spent counseling as documented under my assessment and plan.     Marvia Pickles, PA-C

## 2020-08-15 ENCOUNTER — Inpatient Hospital Stay (INDEPENDENT_AMBULATORY_CARE_PROVIDER_SITE_OTHER): Payer: Medicare Other | Admitting: Hematology and Oncology

## 2020-08-15 ENCOUNTER — Encounter: Payer: Self-pay | Admitting: Hematology and Oncology

## 2020-08-15 ENCOUNTER — Telehealth: Payer: Self-pay | Admitting: Hematology and Oncology

## 2020-08-15 ENCOUNTER — Other Ambulatory Visit: Payer: Self-pay

## 2020-08-15 ENCOUNTER — Inpatient Hospital Stay: Payer: Medicare Other

## 2020-08-15 ENCOUNTER — Inpatient Hospital Stay: Payer: Medicare Other | Admitting: Hematology and Oncology

## 2020-08-15 VITALS — BP 118/63 | HR 79 | Temp 97.6°F | Resp 18 | Ht 70.0 in | Wt 184.9 lb

## 2020-08-15 DIAGNOSIS — C8382 Other non-follicular lymphoma, intrathoracic lymph nodes: Secondary | ICD-10-CM

## 2020-08-15 DIAGNOSIS — J479 Bronchiectasis, uncomplicated: Secondary | ICD-10-CM | POA: Diagnosis not present

## 2020-08-15 DIAGNOSIS — D61810 Antineoplastic chemotherapy induced pancytopenia: Secondary | ICD-10-CM | POA: Diagnosis not present

## 2020-08-15 DIAGNOSIS — I959 Hypotension, unspecified: Secondary | ICD-10-CM | POA: Diagnosis present

## 2020-08-15 DIAGNOSIS — E871 Hypo-osmolality and hyponatremia: Secondary | ICD-10-CM | POA: Diagnosis not present

## 2020-08-15 DIAGNOSIS — Z792 Long term (current) use of antibiotics: Secondary | ICD-10-CM | POA: Diagnosis not present

## 2020-08-15 DIAGNOSIS — D61818 Other pancytopenia: Secondary | ICD-10-CM | POA: Diagnosis present

## 2020-08-15 DIAGNOSIS — R197 Diarrhea, unspecified: Secondary | ICD-10-CM | POA: Diagnosis present

## 2020-08-15 DIAGNOSIS — I10 Essential (primary) hypertension: Secondary | ICD-10-CM | POA: Diagnosis present

## 2020-08-15 DIAGNOSIS — R509 Fever, unspecified: Secondary | ICD-10-CM | POA: Diagnosis not present

## 2020-08-15 DIAGNOSIS — E860 Dehydration: Secondary | ICD-10-CM | POA: Diagnosis present

## 2020-08-15 DIAGNOSIS — K521 Toxic gastroenteritis and colitis: Secondary | ICD-10-CM | POA: Diagnosis not present

## 2020-08-15 DIAGNOSIS — C8590 Non-Hodgkin lymphoma, unspecified, unspecified site: Secondary | ICD-10-CM | POA: Diagnosis present

## 2020-08-15 DIAGNOSIS — C8518 Unspecified B-cell lymphoma, lymph nodes of multiple sites: Secondary | ICD-10-CM | POA: Diagnosis not present

## 2020-08-15 DIAGNOSIS — J189 Pneumonia, unspecified organism: Secondary | ICD-10-CM | POA: Diagnosis present

## 2020-08-15 DIAGNOSIS — F17210 Nicotine dependence, cigarettes, uncomplicated: Secondary | ICD-10-CM | POA: Diagnosis present

## 2020-08-15 DIAGNOSIS — T3695XA Adverse effect of unspecified systemic antibiotic, initial encounter: Secondary | ICD-10-CM | POA: Diagnosis not present

## 2020-08-15 DIAGNOSIS — J440 Chronic obstructive pulmonary disease with acute lower respiratory infection: Secondary | ICD-10-CM | POA: Diagnosis present

## 2020-08-15 DIAGNOSIS — J449 Chronic obstructive pulmonary disease, unspecified: Secondary | ICD-10-CM | POA: Diagnosis present

## 2020-08-15 DIAGNOSIS — Z5111 Encounter for antineoplastic chemotherapy: Secondary | ICD-10-CM | POA: Diagnosis not present

## 2020-08-15 DIAGNOSIS — M1990 Unspecified osteoarthritis, unspecified site: Secondary | ICD-10-CM | POA: Diagnosis present

## 2020-08-15 DIAGNOSIS — K219 Gastro-esophageal reflux disease without esophagitis: Secondary | ICD-10-CM | POA: Diagnosis present

## 2020-08-15 DIAGNOSIS — C8510 Unspecified B-cell lymphoma, unspecified site: Secondary | ICD-10-CM | POA: Diagnosis not present

## 2020-08-15 DIAGNOSIS — M549 Dorsalgia, unspecified: Secondary | ICD-10-CM | POA: Diagnosis present

## 2020-08-15 DIAGNOSIS — I2510 Atherosclerotic heart disease of native coronary artery without angina pectoris: Secondary | ICD-10-CM | POA: Diagnosis present

## 2020-08-15 DIAGNOSIS — J9690 Respiratory failure, unspecified, unspecified whether with hypoxia or hypercapnia: Secondary | ICD-10-CM | POA: Diagnosis not present

## 2020-08-15 DIAGNOSIS — E876 Hypokalemia: Secondary | ICD-10-CM | POA: Diagnosis present

## 2020-08-15 DIAGNOSIS — T451X5A Adverse effect of antineoplastic and immunosuppressive drugs, initial encounter: Secondary | ICD-10-CM | POA: Diagnosis not present

## 2020-08-15 DIAGNOSIS — G8929 Other chronic pain: Secondary | ICD-10-CM | POA: Diagnosis present

## 2020-08-15 DIAGNOSIS — Z049 Encounter for examination and observation for unspecified reason: Secondary | ICD-10-CM | POA: Diagnosis not present

## 2020-08-15 DIAGNOSIS — Z7982 Long term (current) use of aspirin: Secondary | ICD-10-CM | POA: Diagnosis not present

## 2020-08-15 DIAGNOSIS — R5081 Fever presenting with conditions classified elsewhere: Secondary | ICD-10-CM | POA: Diagnosis present

## 2020-08-15 DIAGNOSIS — D709 Neutropenia, unspecified: Secondary | ICD-10-CM | POA: Diagnosis present

## 2020-08-15 DIAGNOSIS — Z8572 Personal history of non-Hodgkin lymphomas: Secondary | ICD-10-CM | POA: Diagnosis not present

## 2020-08-15 DIAGNOSIS — E222 Syndrome of inappropriate secretion of antidiuretic hormone: Secondary | ICD-10-CM | POA: Diagnosis present

## 2020-08-15 DIAGNOSIS — G4733 Obstructive sleep apnea (adult) (pediatric): Secondary | ICD-10-CM | POA: Diagnosis present

## 2020-08-15 LAB — BASIC METABOLIC PANEL
BUN: 16 (ref 4–21)
CO2: 24 — AB (ref 13–22)
Chloride: 98 — AB (ref 99–108)
Creatinine: 0.9 (ref 0.6–1.3)
Glucose: 103
Potassium: 3.9 (ref 3.4–5.3)
Sodium: 128 — AB (ref 137–147)

## 2020-08-15 LAB — CORRECTED CALCIUM (CC13): Calcium, Corrected: 9.2

## 2020-08-15 LAB — CBC AND DIFFERENTIAL
HCT: 33 — AB (ref 41–53)
Hemoglobin: 11.2 — AB (ref 13.5–17.5)
Neutrophils Absolute: 0.42
Platelets: 74 — AB (ref 150–399)
WBC: 1

## 2020-08-15 LAB — CBC
MCV: 88 (ref 80–94)
RBC: 3.75 — AB (ref 3.87–5.11)

## 2020-08-15 LAB — HEPATIC FUNCTION PANEL
ALT: 48 — AB (ref 10–40)
AST: 58 — AB (ref 14–40)
Alkaline Phosphatase: 82 (ref 25–125)
Bilirubin, Total: 0.9

## 2020-08-15 LAB — COMPREHENSIVE METABOLIC PANEL
Albumin: 3.3 — AB (ref 3.5–5.0)
Calcium: 8.5 — AB (ref 8.7–10.7)

## 2020-08-15 LAB — LACTATE DEHYDROGENASE: LDH: 638 — AB (ref 313–618)

## 2020-08-15 NOTE — Telephone Encounter (Signed)
1/18 Spoke with wife and she requested appt be rs until afternoon-due to the weather and patient is in wheelchair.

## 2020-08-15 NOTE — Progress Notes (Signed)
[facility]  [address] [place],  [place]  [address] [phone]  Clinic Day:  08/15/2020  Referring physician: [name], MD   CHIEF COMPLAINT:  CC:   Marginal zone lymphoma  Current Treatment:   Bendamustine/rituximab   HISTORY OF PRESENT ILLNESS:  Antonio Benson is a 65 y.o. male with marginal zone lymphoma, which was proven per a mediastinoscopy with lymph node biopsy in October 2020.  A bone marrow biopsy also showed a lymphohistiocytic infiltration that was worrisome for either his lymphoma or an immune response to it.  He had been receiving bendamustine/rituximab.  He completed 5 cycles on December 17th.  He received Neulasta on December 28JG to prevent complications of prolonged neutropenia.  He was seen on January 11th prior to a potential 6th cycle of bendamustine/rituximab, but was doing poorly at the time.  He reported general weakness and decreased appetite since his last cycle.  He started having fevers of 101 to 102 on January 4th after being exposed to [place] on December 25th. He saw Dr. Lin Landsman and COVID testing was negative.  He otherwise did not have symptoms of infection.  Dr. Lin Landsman placed him on doxycycline 175m twice daily and cefdinir 3051mtwice daily.  He had persistent fever, so his wife took him to see his pulmonologist, where he had a chest x-ray which was normal and flu test which was negative. He also reported low back pain since Thanksgiving, which has been progressing.  He had seen Dr. ReLin Landsmannd had a lumbar spine x-ray in the last few weeks, which apparently did not show significant abnormality.  He was given hydrocodone/APAP 5/325 to use as needed and this did help his pain, but he has used up that prescription.  He then reported right lower extremity weakness and falling.   He had lost 11 pounds over 4 weeks.  He was not neutropenic at that time. We obtained a urine culture and blood cultures x 2, 1 from his  Port-A-Cath, which were negative.  CT chest, abdomen and pelvis was done to reassess his disease.  There was evidence of progressive lymphoma with small mediastinal and retroperitoneal lymph nodes, as well as increased mild splenomegaly with new/increased conspicuity of innumerable small low-attenuation splenic lesions.  New mild-to-moderate patchy ground-glass opacities throughout both lungs, most prominent in the peripheral left upper lobe and superior segment left lower lobe, favor inflammatory etiology, with the differential including atypical infection (such as COVID-19) and drug toxicity. Attention on follow-up chest CT was advised. Due to the ground-glass opacities, we placed him on azithromycin and fluconazole to cover atypical infections.  Due to evidence of progression while on chemotherapy, we recommended repeat biopsy.  His case was discussed with Interventional Radiology and they felt they could biopsy a paraaortic lymph node. We were trying to get this scheduled as an outpatient.  MRI lumbar spine did not reveal a lymphoma mass causing impingement.  There was chronic bilateral pars defects at L5 with 1.0 cm anterior subluxation of L5 on S1 contributing to mild bilateral foraminal impingement at this level due to disc uncovering and mild disc bulge. Lumbar spondylosis and degenerative disc disease, also causing borderline impingement at L3-4 and L4-5 eccentric to the right was also seen.  Reduced T1 signal in the marrow along with diffuse marrow enhancement, compatible with either diffuse infiltrative marrow involvement with lymphoma versus treatment related marrow changes.   Due to his overall condition, we did not refer him regarding  the changes in the lumbar spine yet.   INTERVAL HISTORY:  Antonio Benson is here for routine follow-up and reports continued intermittent fever up to 102, despite completing doxycycline and cefdinir and continuing azithromycin and fluconazole.  He denies specific symptoms ,  such as sore throat, cough, diarrhea, or body aches. He continues to get weaker. He remains in a wheelchair today.  His appetite remains poor.  His weight has decreased 3 pounds over last week.  REVIEW OF SYSTEMS:  Review of Systems  Constitutional: Positive for appetite change, fatigue, fever and unexpected weight change. Negative for chills.  HENT:   Negative for lump/mass, mouth sores and sore throat.   Eyes: Negative for icterus.  Respiratory: Negative for chest tightness, hemoptysis and wheezing.   Cardiovascular: Negative for chest pain, leg swelling and palpitations.  Gastrointestinal: Negative for abdominal pain, blood in stool, constipation, diarrhea, nausea and vomiting.  Endocrine: Negative for hot flashes.  Genitourinary: Negative for difficulty urinating, dysuria, frequency and hematuria.   Musculoskeletal: Positive for back pain (chronic 5/10). Negative for arthralgias and myalgias.  Skin: Negative for itching, rash and wound.  Neurological: Negative for dizziness, extremity weakness, headaches, light-headedness and numbness.  Hematological: Negative for adenopathy. Does not bruise/bleed easily.  Psychiatric/Behavioral: Positive for sleep disturbance. Negative for depression. The patient is not nervous/anxious.      VITALS:  Blood pressure 118/63, pulse 79, temperature 97.6 F (36.4 C), temperature source Oral, resp. rate 18, height 5' 10"  (1.778 m), weight 184 lb 14.4 oz (83.9 kg), SpO2 96 %.  Wt Readings from Last 3 Encounters:  08/15/20 184 lb 14.4 oz (83.9 kg)  08/08/20 187 lb 4.8 oz (85 kg)  07/17/20 196 lb 6.4 oz (89.1 kg)    Body mass index is 26.53 kg/m.  Performance status (ECOG): 2 - Symptomatic, <50% confined to bed  PHYSICAL EXAM:  Physical Exam Vitals and nursing note reviewed.  Constitutional:      General: He is not in acute distress.    Appearance: Normal appearance. He is normal weight. He is ill-appearing.  HENT:     Head: Normocephalic and  atraumatic.     Mouth/Throat:     Mouth: Mucous membranes are moist.     Pharynx: Oropharynx is clear. No oropharyngeal exudate or posterior oropharyngeal erythema.  Eyes:     General: No scleral icterus.    Extraocular Movements: Extraocular movements intact.     Conjunctiva/sclera: Conjunctivae normal.     Pupils: Pupils are equal, round, and reactive to light.  Cardiovascular:     Rate and Rhythm: Normal rate and regular rhythm.     Heart sounds: Normal heart sounds. No murmur heard. No friction rub. No gallop.   Pulmonary:     Effort: Pulmonary effort is normal. No respiratory distress.     Breath sounds: Normal breath sounds. No stridor. No wheezing, rhonchi or rales.  Chest:  Breasts:     Right: No axillary adenopathy or supraclavicular adenopathy.     Left: No axillary adenopathy or supraclavicular adenopathy.    Abdominal:     General: Bowel sounds are normal. There is no distension.     Palpations: Abdomen is soft. There is no hepatomegaly, splenomegaly or mass.     Tenderness: There is no abdominal tenderness. There is no guarding.     Hernia: No hernia is present.  Musculoskeletal:        General: Normal range of motion.     Cervical back: Normal range of motion and neck supple.  No tenderness.  Lymphadenopathy:     Cervical: No cervical adenopathy.     Upper Body:     Right upper body: No supraclavicular or axillary adenopathy.     Left upper body: No supraclavicular or axillary adenopathy.     Lower Body: No right inguinal adenopathy. No left inguinal adenopathy.  Skin:    General: Skin is warm and dry.     Coloration: Skin is not jaundiced.     Findings: No rash.  Neurological:     Mental Status: He is alert and oriented to person, place, and time.     Cranial Nerves: No cranial nerve deficit.  Psychiatric:        Mood and Affect: Mood normal.        Behavior: Behavior normal.        Thought Content: Thought content normal.    LABS:   CBC Latest Ref Rng  & Units 08/15/2020 08/08/2020 07/12/2020  WBC - 1.0 1.9 2.9  Hemoglobin 13.5 - 17.5 11.2(A) 12.7(A) 13.5  Hematocrit 41 - 53 33(A) 37(A) 40(A)  Platelets 150 - 399 74(A) 84(A) 180   CMP Latest Ref Rng & Units 08/15/2020 08/08/2020 07/12/2020  Glucose 70 - 99 mg/dL - - -  BUN 4 - 21 16 16 9   Creatinine 0.6 - 1.3 0.9 1.0 0.9  Sodium 137 - 147 128(A) 136(A) 140  Potassium 3.4 - 5.3 3.9 3.2(A) 3.8  Chloride 99 - 108 98(A) 102 106  CO2 13 - 22 24(A) 28(A) 30(A)  Calcium 8.7 - 10.7 8.5(A) 9.1 9.4  Total Protein 6.5 - 8.1 g/dL - - -  Total Bilirubin 0.3 - 1.2 mg/dL - - -  Alkaline Phos 25 - 125 82 82 93  AST 14 - 40 58(A) 34 31  ALT 10 - 40 48(A) 22 17     No results found for: CEA1 / No results found for: CEA1 No results found for: PSA1 No results found for: TWK462 No results found for: MMN817  No results found for: TOTALPROTELP, ALBUMINELP, A1GS, A2GS, BETS, BETA2SER, GAMS, MSPIKE, SPEI No results found for: TIBC, FERRITIN, IRONPCTSAT Lab Results  Component Value Date   LDH 638 (A) 08/15/2020    STUDIES:  No results found.    HISTORY:   Past Medical History:  Diagnosis Date  . Anemia    low iron  . Arthritis   . COPD (chronic obstructive pulmonary disease) ([place])   . Coronary artery disease    1 stent  . GERD (gastroesophageal reflux disease)   . Headache   . Hyperlipidemia   . Hypertension   . Myocardial infarction ([place])    2015 or 2016  . Pneumonia   . Sinus trouble   . Sleep apnea    no longer using Cpap due to weight loss    Past Surgical History:  Procedure Laterality Date  . CARDIAC CATHETERIZATION    . CORONARY ANGIOPLASTY    . KNEE SURGERY Left    arthroscopy - meniscus tear  . MEDIASTINOSCOPY N/A 05/24/2019   Procedure: MEDIASTINOSCOPY;  Surgeon: Melrose Nakayama, MD;  Location: [facility] OR;  Service: Thoracic;  Laterality: N/A;  . NASAL SINUS SURGERY  1990's   deviated septum   . TONSILLECTOMY      Family History  Problem Relation Age of Onset   . Asthma Mother   . Aneurysm Father     Social History:  reports that he has been smoking cigarettes. He has a 40.00 pack-year smoking history.  He has never used smokeless tobacco. He reports that he does not drink alcohol and does not use drugs.The patient is accompanied by his wife today.  Allergies: No Known Allergies  Current Medications: Current Outpatient Medications  Medication Sig Dispense Refill  . albuterol (PROVENTIL HFA;VENTOLIN HFA) 108 (90 BASE) MCG/ACT inhaler Inhale 2 puffs into the lungs every 6 (six) hours as needed for wheezing or shortness of breath.    Marland Kitchen albuterol (PROVENTIL) (2.5 MG/3ML) 0.083% nebulizer solution Take 3 mLs (2.5 mg total) by nebulization every 6 (six) hours as needed for wheezing or shortness of breath. 120 mL 12  . aspirin 81 MG tablet Take 81 mg by mouth daily.    Marland Kitchen atorvastatin (LIPITOR) 80 MG tablet TAKE 1 TABLET BY MOUTH DAILY AT 6 PM. 90 tablet 2  . azithromycin (ZITHROMAX) 500 MG tablet Take 1 tablet (500 mg total) by mouth daily. 7 tablet 0  . carvedilol (COREG) 3.125 MG tablet TAKE 1 TABLET (3.125 MG TOTAL) BY MOUTH 2 (TWO) TIMES DAILY. 180 tablet 2  . ferrous sulfate 325 (65 FE) MG tablet Take 325 mg by mouth daily.    . fluconazole (DIFLUCAN) 200 MG tablet Take 1 tablet (200 mg total) by mouth daily. 7 tablet 0  . guaiFENesin-codeine 100-10 MG/5ML syrup Take by mouth.    Marland Kitchen HYDROcodone-acetaminophen (NORCO/VICODIN) 5-325 MG tablet Take 1 tablet by mouth every 6 (six) hours as needed for moderate pain. 30 tablet 0  . ipratropium-albuterol (DUONEB) 0.5-2.5 (3) MG/3ML SOLN SMARTSIG:1 Vial(s) Via Nebulizer Every 4-6 Hours PRN    . Multiple Vitamin (MULTIVITAMIN WITH MINERALS) TABS tablet Take 1 tablet by mouth daily.    . nitroGLYCERIN (NITROSTAT) 0.4 MG SL tablet PLACE 1 TABLET (0.4 MG TOTAL) UNDER THE TONGUE EVERY 5 (FIVE) MINUTES AS NEEDED FOR CHEST PAIN. 75 tablet 3  . ondansetron (ZOFRAN) 4 MG tablet Take 4 mg by mouth every 8 (eight) hours  as needed for nausea or vomiting.    . potassium chloride (KLOR-CON) 10 MEQ tablet Take 2 tablets (20 mEq total) by mouth daily. 14 tablet 0  . prochlorperazine (COMPAZINE) 10 MG tablet Take 10 mg by mouth every 6 (six) hours as needed for nausea or vomiting.    . sodium chloride (OCEAN) 0.65 % SOLN nasal spray Place 1 spray into both nostrils as needed for congestion.    . TRELEGY ELLIPTA 100-62.5-25 MCG/INH AEPB Inhale 1 puff into the lungs daily.     No current facility-administered medications for this visit.     ASSESSMENT & PLAN:   Assessment/Plan:   1. Marginal zone lymphoma.  He had difficulty tolerating his last cycle of bendamustine/rituximab mainly due to fatigue, weakness and decreased appetite. Repeat imaging revealed mild progression of his disease with only mild elevation of the LDH.  Repeat biopsy is recommended to evaluate for transformation.  However, we have not felt the lymphoma is the cause of his fevers.  He has worsening pancytopenia with recurrent neutropenia, which may be due to his disease.   2. Fever of unknown origin despite antibiotics. CT chest revealed new mild-to-moderate patchy ground-glass opacities throughout both lungs, possibly representing an atypical infection.  The patient has had persistent fevers despite additional antibiotic and antifungal. As he is now neutropenic, we would recommend admission for further evaluation and management. I discussed his case with the hospitalist, and if his COVID testing is negative today, he can be admitted directly. 3. Low back pain with new onset right lower extremity weakness and  falling. He has degenerative disc disease felt to be the cause. Due to his overall condition, a referral has not been made regarding this.  He has hydrocodone/APAP 3/325 to use as needed. 4. Mild hypokalemia, resolved with potassium chloride 20 meq daily for 7 days. 5. Anorexia and weight loss, potentially due to his lymphoma. 6. Hyponatremia of  unknown etiology.   We will plan to follow along during his hospitalization and arrange follow-up upon discharge.  I provided 40 minutes of face-to-face time during this this encounter and > 50% was spent counseling as documented under my assessment and plan.     Marvia Pickles, PA-C

## 2020-08-16 ENCOUNTER — Encounter: Payer: Self-pay | Admitting: Hematology and Oncology

## 2020-08-16 NOTE — Progress Notes (Signed)
Patient direct admitted to 425 on 08/15/2020. Report given to Perrin Maltese, RN. Patient transported via W/C.

## 2020-08-21 DIAGNOSIS — R918 Other nonspecific abnormal finding of lung field: Secondary | ICD-10-CM | POA: Diagnosis not present

## 2020-08-21 DIAGNOSIS — J449 Chronic obstructive pulmonary disease, unspecified: Secondary | ICD-10-CM | POA: Diagnosis not present

## 2020-08-21 DIAGNOSIS — K219 Gastro-esophageal reflux disease without esophagitis: Secondary | ICD-10-CM | POA: Diagnosis present

## 2020-08-21 DIAGNOSIS — C8339 Diffuse large B-cell lymphoma, extranodal and solid organ sites: Secondary | ICD-10-CM | POA: Diagnosis not present

## 2020-08-21 DIAGNOSIS — E871 Hypo-osmolality and hyponatremia: Secondary | ICD-10-CM | POA: Diagnosis not present

## 2020-08-21 DIAGNOSIS — H903 Sensorineural hearing loss, bilateral: Secondary | ICD-10-CM | POA: Diagnosis present

## 2020-08-21 DIAGNOSIS — I700 Atherosclerosis of aorta: Secondary | ICD-10-CM | POA: Diagnosis not present

## 2020-08-21 DIAGNOSIS — C8388 Other non-follicular lymphoma, lymph nodes of multiple sites: Secondary | ICD-10-CM | POA: Diagnosis not present

## 2020-08-21 DIAGNOSIS — D709 Neutropenia, unspecified: Secondary | ICD-10-CM | POA: Diagnosis present

## 2020-08-21 DIAGNOSIS — Z79899 Other long term (current) drug therapy: Secondary | ICD-10-CM | POA: Diagnosis not present

## 2020-08-21 DIAGNOSIS — R5081 Fever presenting with conditions classified elsewhere: Secondary | ICD-10-CM | POA: Diagnosis present

## 2020-08-21 DIAGNOSIS — C8582 Other specified types of non-Hodgkin lymphoma, intrathoracic lymph nodes: Secondary | ICD-10-CM | POA: Diagnosis not present

## 2020-08-21 DIAGNOSIS — Z01810 Encounter for preprocedural cardiovascular examination: Secondary | ICD-10-CM | POA: Diagnosis not present

## 2020-08-21 DIAGNOSIS — Z955 Presence of coronary angioplasty implant and graft: Secondary | ICD-10-CM | POA: Diagnosis not present

## 2020-08-21 DIAGNOSIS — E669 Obesity, unspecified: Secondary | ICD-10-CM | POA: Diagnosis present

## 2020-08-21 DIAGNOSIS — Z720 Tobacco use: Secondary | ICD-10-CM | POA: Diagnosis not present

## 2020-08-21 DIAGNOSIS — B440 Invasive pulmonary aspergillosis: Secondary | ICD-10-CM | POA: Diagnosis not present

## 2020-08-21 DIAGNOSIS — D61818 Other pancytopenia: Secondary | ICD-10-CM | POA: Diagnosis not present

## 2020-08-21 DIAGNOSIS — Z20822 Contact with and (suspected) exposure to covid-19: Secondary | ICD-10-CM | POA: Diagnosis present

## 2020-08-21 DIAGNOSIS — F17210 Nicotine dependence, cigarettes, uncomplicated: Secondary | ICD-10-CM | POA: Diagnosis present

## 2020-08-21 DIAGNOSIS — C8382 Other non-follicular lymphoma, intrathoracic lymph nodes: Secondary | ICD-10-CM | POA: Diagnosis not present

## 2020-08-21 DIAGNOSIS — B379 Candidiasis, unspecified: Secondary | ICD-10-CM | POA: Diagnosis not present

## 2020-08-21 DIAGNOSIS — R590 Localized enlarged lymph nodes: Secondary | ICD-10-CM | POA: Diagnosis present

## 2020-08-21 DIAGNOSIS — T451X5A Adverse effect of antineoplastic and immunosuppressive drugs, initial encounter: Secondary | ICD-10-CM | POA: Diagnosis present

## 2020-08-21 DIAGNOSIS — D84821 Immunodeficiency due to drugs: Secondary | ICD-10-CM | POA: Diagnosis not present

## 2020-08-21 DIAGNOSIS — G4733 Obstructive sleep apnea (adult) (pediatric): Secondary | ICD-10-CM | POA: Diagnosis present

## 2020-08-21 DIAGNOSIS — C884 Extranodal marginal zone B-cell lymphoma of mucosa-associated lymphoid tissue [MALT-lymphoma]: Secondary | ICD-10-CM | POA: Diagnosis not present

## 2020-08-21 DIAGNOSIS — R5381 Other malaise: Secondary | ICD-10-CM | POA: Diagnosis not present

## 2020-08-21 DIAGNOSIS — C8512 Unspecified B-cell lymphoma, intrathoracic lymph nodes: Secondary | ICD-10-CM | POA: Diagnosis present

## 2020-08-21 DIAGNOSIS — R509 Fever, unspecified: Secondary | ICD-10-CM | POA: Diagnosis not present

## 2020-08-21 DIAGNOSIS — R197 Diarrhea, unspecified: Secondary | ICD-10-CM | POA: Diagnosis not present

## 2020-08-21 DIAGNOSIS — B449 Aspergillosis, unspecified: Secondary | ICD-10-CM | POA: Diagnosis present

## 2020-08-21 DIAGNOSIS — C8580 Other specified types of non-Hodgkin lymphoma, unspecified site: Secondary | ICD-10-CM | POA: Diagnosis not present

## 2020-08-21 DIAGNOSIS — D708 Other neutropenia: Secondary | ICD-10-CM | POA: Diagnosis not present

## 2020-08-21 DIAGNOSIS — B441 Other pulmonary aspergillosis: Secondary | ICD-10-CM | POA: Diagnosis not present

## 2020-08-21 DIAGNOSIS — D61810 Antineoplastic chemotherapy induced pancytopenia: Secondary | ICD-10-CM | POA: Diagnosis present

## 2020-08-21 DIAGNOSIS — D849 Immunodeficiency, unspecified: Secondary | ICD-10-CM | POA: Diagnosis present

## 2020-08-21 DIAGNOSIS — C8510 Unspecified B-cell lymphoma, unspecified site: Secondary | ICD-10-CM | POA: Diagnosis not present

## 2020-08-21 DIAGNOSIS — I517 Cardiomegaly: Secondary | ICD-10-CM | POA: Diagnosis not present

## 2020-08-21 DIAGNOSIS — D761 Hemophagocytic lymphohistiocytosis: Secondary | ICD-10-CM | POA: Diagnosis present

## 2020-08-21 DIAGNOSIS — Z7982 Long term (current) use of aspirin: Secondary | ICD-10-CM | POA: Diagnosis not present

## 2020-08-21 DIAGNOSIS — I10 Essential (primary) hypertension: Secondary | ICD-10-CM | POA: Diagnosis present

## 2020-08-21 DIAGNOSIS — E785 Hyperlipidemia, unspecified: Secondary | ICD-10-CM | POA: Diagnosis present

## 2020-08-21 DIAGNOSIS — R161 Splenomegaly, not elsewhere classified: Secondary | ICD-10-CM | POA: Diagnosis present

## 2020-08-21 DIAGNOSIS — I2510 Atherosclerotic heart disease of native coronary artery without angina pectoris: Secondary | ICD-10-CM | POA: Diagnosis present

## 2020-08-22 DIAGNOSIS — C8382 Other non-follicular lymphoma, intrathoracic lymph nodes: Secondary | ICD-10-CM | POA: Diagnosis not present

## 2020-08-22 DIAGNOSIS — R918 Other nonspecific abnormal finding of lung field: Secondary | ICD-10-CM | POA: Diagnosis not present

## 2020-08-22 DIAGNOSIS — D84821 Immunodeficiency due to drugs: Secondary | ICD-10-CM | POA: Diagnosis not present

## 2020-08-22 DIAGNOSIS — R509 Fever, unspecified: Secondary | ICD-10-CM | POA: Diagnosis not present

## 2020-08-22 DIAGNOSIS — C884 Extranodal marginal zone B-cell lymphoma of mucosa-associated lymphoid tissue [MALT-lymphoma]: Secondary | ICD-10-CM | POA: Diagnosis not present

## 2020-08-23 DIAGNOSIS — D709 Neutropenia, unspecified: Secondary | ICD-10-CM | POA: Diagnosis not present

## 2020-08-23 DIAGNOSIS — D61818 Other pancytopenia: Secondary | ICD-10-CM | POA: Diagnosis not present

## 2020-08-23 DIAGNOSIS — C8512 Unspecified B-cell lymphoma, intrathoracic lymph nodes: Secondary | ICD-10-CM | POA: Diagnosis not present

## 2020-08-23 DIAGNOSIS — C8388 Other non-follicular lymphoma, lymph nodes of multiple sites: Secondary | ICD-10-CM | POA: Diagnosis not present

## 2020-08-23 DIAGNOSIS — J449 Chronic obstructive pulmonary disease, unspecified: Secondary | ICD-10-CM | POA: Diagnosis not present

## 2020-08-23 DIAGNOSIS — R5081 Fever presenting with conditions classified elsewhere: Secondary | ICD-10-CM | POA: Diagnosis not present

## 2020-08-23 DIAGNOSIS — I700 Atherosclerosis of aorta: Secondary | ICD-10-CM | POA: Diagnosis not present

## 2020-08-23 DIAGNOSIS — I2510 Atherosclerotic heart disease of native coronary artery without angina pectoris: Secondary | ICD-10-CM | POA: Diagnosis not present

## 2020-08-23 DIAGNOSIS — C8382 Other non-follicular lymphoma, intrathoracic lymph nodes: Secondary | ICD-10-CM | POA: Diagnosis not present

## 2020-08-23 DIAGNOSIS — Z79899 Other long term (current) drug therapy: Secondary | ICD-10-CM | POA: Diagnosis not present

## 2020-08-23 DIAGNOSIS — Z01810 Encounter for preprocedural cardiovascular examination: Secondary | ICD-10-CM | POA: Diagnosis not present

## 2020-08-23 DIAGNOSIS — R509 Fever, unspecified: Secondary | ICD-10-CM | POA: Diagnosis not present

## 2020-08-23 DIAGNOSIS — I517 Cardiomegaly: Secondary | ICD-10-CM | POA: Diagnosis not present

## 2020-08-23 DIAGNOSIS — C884 Extranodal marginal zone B-cell lymphoma of mucosa-associated lymphoid tissue [MALT-lymphoma]: Secondary | ICD-10-CM | POA: Diagnosis not present

## 2020-08-23 DIAGNOSIS — R197 Diarrhea, unspecified: Secondary | ICD-10-CM | POA: Diagnosis not present

## 2020-08-23 DIAGNOSIS — Z720 Tobacco use: Secondary | ICD-10-CM | POA: Diagnosis not present

## 2020-08-23 DIAGNOSIS — D84821 Immunodeficiency due to drugs: Secondary | ICD-10-CM | POA: Diagnosis not present

## 2020-08-24 DIAGNOSIS — D84821 Immunodeficiency due to drugs: Secondary | ICD-10-CM | POA: Diagnosis not present

## 2020-08-24 DIAGNOSIS — D61818 Other pancytopenia: Secondary | ICD-10-CM | POA: Diagnosis not present

## 2020-08-24 DIAGNOSIS — C8512 Unspecified B-cell lymphoma, intrathoracic lymph nodes: Secondary | ICD-10-CM | POA: Diagnosis not present

## 2020-08-24 DIAGNOSIS — I2510 Atherosclerotic heart disease of native coronary artery without angina pectoris: Secondary | ICD-10-CM | POA: Diagnosis not present

## 2020-08-24 DIAGNOSIS — R590 Localized enlarged lymph nodes: Secondary | ICD-10-CM | POA: Diagnosis not present

## 2020-08-24 DIAGNOSIS — J449 Chronic obstructive pulmonary disease, unspecified: Secondary | ICD-10-CM | POA: Diagnosis not present

## 2020-08-24 DIAGNOSIS — C8382 Other non-follicular lymphoma, intrathoracic lymph nodes: Secondary | ICD-10-CM | POA: Diagnosis not present

## 2020-08-24 DIAGNOSIS — R5081 Fever presenting with conditions classified elsewhere: Secondary | ICD-10-CM | POA: Diagnosis not present

## 2020-08-24 DIAGNOSIS — Z720 Tobacco use: Secondary | ICD-10-CM | POA: Diagnosis not present

## 2020-08-24 DIAGNOSIS — C884 Extranodal marginal zone B-cell lymphoma of mucosa-associated lymphoid tissue [MALT-lymphoma]: Secondary | ICD-10-CM | POA: Diagnosis not present

## 2020-08-24 DIAGNOSIS — D709 Neutropenia, unspecified: Secondary | ICD-10-CM | POA: Diagnosis not present

## 2020-08-24 DIAGNOSIS — R918 Other nonspecific abnormal finding of lung field: Secondary | ICD-10-CM | POA: Diagnosis not present

## 2020-08-24 DIAGNOSIS — R509 Fever, unspecified: Secondary | ICD-10-CM | POA: Diagnosis not present

## 2020-08-24 DIAGNOSIS — R197 Diarrhea, unspecified: Secondary | ICD-10-CM | POA: Diagnosis not present

## 2020-08-24 DIAGNOSIS — D708 Other neutropenia: Secondary | ICD-10-CM | POA: Diagnosis not present

## 2020-08-25 DIAGNOSIS — C8382 Other non-follicular lymphoma, intrathoracic lymph nodes: Secondary | ICD-10-CM | POA: Diagnosis not present

## 2020-08-25 DIAGNOSIS — C884 Extranodal marginal zone B-cell lymphoma of mucosa-associated lymphoid tissue [MALT-lymphoma]: Secondary | ICD-10-CM | POA: Diagnosis not present

## 2020-08-25 DIAGNOSIS — C8512 Unspecified B-cell lymphoma, intrathoracic lymph nodes: Secondary | ICD-10-CM | POA: Diagnosis not present

## 2020-08-25 DIAGNOSIS — B379 Candidiasis, unspecified: Secondary | ICD-10-CM | POA: Diagnosis not present

## 2020-08-25 DIAGNOSIS — R918 Other nonspecific abnormal finding of lung field: Secondary | ICD-10-CM | POA: Diagnosis not present

## 2020-08-25 DIAGNOSIS — D84821 Immunodeficiency due to drugs: Secondary | ICD-10-CM | POA: Diagnosis not present

## 2020-08-25 DIAGNOSIS — R509 Fever, unspecified: Secondary | ICD-10-CM | POA: Diagnosis not present

## 2020-08-25 DIAGNOSIS — D61818 Other pancytopenia: Secondary | ICD-10-CM | POA: Diagnosis not present

## 2020-08-25 DIAGNOSIS — R197 Diarrhea, unspecified: Secondary | ICD-10-CM | POA: Diagnosis not present

## 2020-08-26 DIAGNOSIS — R197 Diarrhea, unspecified: Secondary | ICD-10-CM | POA: Diagnosis not present

## 2020-08-26 DIAGNOSIS — C8512 Unspecified B-cell lymphoma, intrathoracic lymph nodes: Secondary | ICD-10-CM | POA: Diagnosis not present

## 2020-08-26 DIAGNOSIS — B379 Candidiasis, unspecified: Secondary | ICD-10-CM | POA: Diagnosis not present

## 2020-08-26 DIAGNOSIS — D61818 Other pancytopenia: Secondary | ICD-10-CM | POA: Diagnosis not present

## 2020-08-26 DIAGNOSIS — R509 Fever, unspecified: Secondary | ICD-10-CM | POA: Diagnosis not present

## 2020-08-27 DIAGNOSIS — R509 Fever, unspecified: Secondary | ICD-10-CM | POA: Diagnosis not present

## 2020-08-27 DIAGNOSIS — B379 Candidiasis, unspecified: Secondary | ICD-10-CM | POA: Diagnosis not present

## 2020-08-27 DIAGNOSIS — R197 Diarrhea, unspecified: Secondary | ICD-10-CM | POA: Diagnosis not present

## 2020-08-27 DIAGNOSIS — D61818 Other pancytopenia: Secondary | ICD-10-CM | POA: Diagnosis not present

## 2020-08-27 DIAGNOSIS — C8512 Unspecified B-cell lymphoma, intrathoracic lymph nodes: Secondary | ICD-10-CM | POA: Diagnosis not present

## 2020-08-27 DIAGNOSIS — C884 Extranodal marginal zone B-cell lymphoma of mucosa-associated lymphoid tissue [MALT-lymphoma]: Secondary | ICD-10-CM | POA: Diagnosis not present

## 2020-08-27 DIAGNOSIS — D84821 Immunodeficiency due to drugs: Secondary | ICD-10-CM | POA: Diagnosis not present

## 2020-08-27 DIAGNOSIS — D761 Hemophagocytic lymphohistiocytosis: Secondary | ICD-10-CM | POA: Diagnosis not present

## 2020-08-28 DIAGNOSIS — R509 Fever, unspecified: Secondary | ICD-10-CM | POA: Diagnosis not present

## 2020-08-28 DIAGNOSIS — R197 Diarrhea, unspecified: Secondary | ICD-10-CM | POA: Diagnosis not present

## 2020-08-28 DIAGNOSIS — C8382 Other non-follicular lymphoma, intrathoracic lymph nodes: Secondary | ICD-10-CM | POA: Diagnosis not present

## 2020-08-28 DIAGNOSIS — D61818 Other pancytopenia: Secondary | ICD-10-CM | POA: Diagnosis not present

## 2020-08-28 DIAGNOSIS — D761 Hemophagocytic lymphohistiocytosis: Secondary | ICD-10-CM | POA: Diagnosis not present

## 2020-08-28 DIAGNOSIS — B379 Candidiasis, unspecified: Secondary | ICD-10-CM | POA: Diagnosis not present

## 2020-08-29 DIAGNOSIS — B379 Candidiasis, unspecified: Secondary | ICD-10-CM | POA: Diagnosis not present

## 2020-08-29 DIAGNOSIS — C8510 Unspecified B-cell lymphoma, unspecified site: Secondary | ICD-10-CM | POA: Diagnosis not present

## 2020-08-29 DIAGNOSIS — D761 Hemophagocytic lymphohistiocytosis: Secondary | ICD-10-CM | POA: Diagnosis not present

## 2020-08-29 DIAGNOSIS — R509 Fever, unspecified: Secondary | ICD-10-CM | POA: Diagnosis not present

## 2020-08-29 DIAGNOSIS — R197 Diarrhea, unspecified: Secondary | ICD-10-CM | POA: Diagnosis not present

## 2020-08-29 DIAGNOSIS — C8582 Other specified types of non-Hodgkin lymphoma, intrathoracic lymph nodes: Secondary | ICD-10-CM | POA: Diagnosis not present

## 2020-08-29 DIAGNOSIS — D61818 Other pancytopenia: Secondary | ICD-10-CM | POA: Diagnosis not present

## 2020-08-30 DIAGNOSIS — Z720 Tobacco use: Secondary | ICD-10-CM | POA: Diagnosis not present

## 2020-08-30 DIAGNOSIS — R590 Localized enlarged lymph nodes: Secondary | ICD-10-CM | POA: Diagnosis not present

## 2020-08-30 DIAGNOSIS — R5381 Other malaise: Secondary | ICD-10-CM | POA: Diagnosis not present

## 2020-08-30 DIAGNOSIS — J449 Chronic obstructive pulmonary disease, unspecified: Secondary | ICD-10-CM | POA: Diagnosis not present

## 2020-08-30 DIAGNOSIS — D61818 Other pancytopenia: Secondary | ICD-10-CM | POA: Diagnosis not present

## 2020-08-30 DIAGNOSIS — E871 Hypo-osmolality and hyponatremia: Secondary | ICD-10-CM | POA: Diagnosis not present

## 2020-08-30 DIAGNOSIS — D761 Hemophagocytic lymphohistiocytosis: Secondary | ICD-10-CM | POA: Diagnosis not present

## 2020-08-30 DIAGNOSIS — I2510 Atherosclerotic heart disease of native coronary artery without angina pectoris: Secondary | ICD-10-CM | POA: Diagnosis not present

## 2020-08-30 DIAGNOSIS — R509 Fever, unspecified: Secondary | ICD-10-CM | POA: Diagnosis not present

## 2020-08-30 DIAGNOSIS — C8582 Other specified types of non-Hodgkin lymphoma, intrathoracic lymph nodes: Secondary | ICD-10-CM | POA: Diagnosis not present

## 2020-08-31 DIAGNOSIS — I2510 Atherosclerotic heart disease of native coronary artery without angina pectoris: Secondary | ICD-10-CM | POA: Diagnosis not present

## 2020-08-31 DIAGNOSIS — R509 Fever, unspecified: Secondary | ICD-10-CM | POA: Diagnosis not present

## 2020-08-31 DIAGNOSIS — D761 Hemophagocytic lymphohistiocytosis: Secondary | ICD-10-CM | POA: Diagnosis not present

## 2020-08-31 DIAGNOSIS — C8582 Other specified types of non-Hodgkin lymphoma, intrathoracic lymph nodes: Secondary | ICD-10-CM | POA: Diagnosis not present

## 2020-08-31 DIAGNOSIS — D61818 Other pancytopenia: Secondary | ICD-10-CM | POA: Diagnosis not present

## 2020-09-01 DIAGNOSIS — D61818 Other pancytopenia: Secondary | ICD-10-CM | POA: Diagnosis not present

## 2020-09-01 DIAGNOSIS — C8582 Other specified types of non-Hodgkin lymphoma, intrathoracic lymph nodes: Secondary | ICD-10-CM | POA: Diagnosis not present

## 2020-09-01 DIAGNOSIS — B441 Other pulmonary aspergillosis: Secondary | ICD-10-CM | POA: Diagnosis not present

## 2020-09-01 DIAGNOSIS — D761 Hemophagocytic lymphohistiocytosis: Secondary | ICD-10-CM | POA: Diagnosis not present

## 2020-09-01 DIAGNOSIS — R509 Fever, unspecified: Secondary | ICD-10-CM | POA: Diagnosis not present

## 2020-09-02 DIAGNOSIS — D761 Hemophagocytic lymphohistiocytosis: Secondary | ICD-10-CM | POA: Diagnosis not present

## 2020-09-02 DIAGNOSIS — C8582 Other specified types of non-Hodgkin lymphoma, intrathoracic lymph nodes: Secondary | ICD-10-CM | POA: Diagnosis not present

## 2020-09-02 DIAGNOSIS — R509 Fever, unspecified: Secondary | ICD-10-CM | POA: Diagnosis not present

## 2020-09-02 DIAGNOSIS — D61818 Other pancytopenia: Secondary | ICD-10-CM | POA: Diagnosis not present

## 2020-09-02 DIAGNOSIS — B441 Other pulmonary aspergillosis: Secondary | ICD-10-CM | POA: Diagnosis not present

## 2020-09-03 DIAGNOSIS — R509 Fever, unspecified: Secondary | ICD-10-CM | POA: Diagnosis not present

## 2020-09-03 DIAGNOSIS — C884 Extranodal marginal zone B-cell lymphoma of mucosa-associated lymphoid tissue [MALT-lymphoma]: Secondary | ICD-10-CM | POA: Diagnosis not present

## 2020-09-03 DIAGNOSIS — B440 Invasive pulmonary aspergillosis: Secondary | ICD-10-CM | POA: Diagnosis not present

## 2020-09-04 DIAGNOSIS — R509 Fever, unspecified: Secondary | ICD-10-CM | POA: Diagnosis not present

## 2020-09-04 DIAGNOSIS — D61818 Other pancytopenia: Secondary | ICD-10-CM | POA: Diagnosis not present

## 2020-09-04 DIAGNOSIS — B441 Other pulmonary aspergillosis: Secondary | ICD-10-CM | POA: Diagnosis not present

## 2020-09-04 DIAGNOSIS — C8580 Other specified types of non-Hodgkin lymphoma, unspecified site: Secondary | ICD-10-CM | POA: Diagnosis not present

## 2020-09-04 DIAGNOSIS — D761 Hemophagocytic lymphohistiocytosis: Secondary | ICD-10-CM | POA: Diagnosis not present

## 2020-09-05 DIAGNOSIS — E785 Hyperlipidemia, unspecified: Secondary | ICD-10-CM | POA: Diagnosis not present

## 2020-09-05 DIAGNOSIS — E871 Hypo-osmolality and hyponatremia: Secondary | ICD-10-CM | POA: Diagnosis not present

## 2020-09-05 DIAGNOSIS — Z955 Presence of coronary angioplasty implant and graft: Secondary | ICD-10-CM | POA: Diagnosis not present

## 2020-09-05 DIAGNOSIS — F17210 Nicotine dependence, cigarettes, uncomplicated: Secondary | ICD-10-CM | POA: Diagnosis not present

## 2020-09-05 DIAGNOSIS — C8382 Other non-follicular lymphoma, intrathoracic lymph nodes: Secondary | ICD-10-CM | POA: Diagnosis not present

## 2020-09-05 DIAGNOSIS — I10 Essential (primary) hypertension: Secondary | ICD-10-CM | POA: Diagnosis not present

## 2020-09-05 DIAGNOSIS — K219 Gastro-esophageal reflux disease without esophagitis: Secondary | ICD-10-CM | POA: Diagnosis not present

## 2020-09-05 DIAGNOSIS — R509 Fever, unspecified: Secondary | ICD-10-CM | POA: Diagnosis not present

## 2020-09-05 DIAGNOSIS — Z7951 Long term (current) use of inhaled steroids: Secondary | ICD-10-CM | POA: Diagnosis not present

## 2020-09-05 DIAGNOSIS — J449 Chronic obstructive pulmonary disease, unspecified: Secondary | ICD-10-CM | POA: Diagnosis not present

## 2020-09-05 DIAGNOSIS — D761 Hemophagocytic lymphohistiocytosis: Secondary | ICD-10-CM | POA: Diagnosis not present

## 2020-09-05 DIAGNOSIS — I2510 Atherosclerotic heart disease of native coronary artery without angina pectoris: Secondary | ICD-10-CM | POA: Diagnosis not present

## 2020-09-05 DIAGNOSIS — Z7952 Long term (current) use of systemic steroids: Secondary | ICD-10-CM | POA: Diagnosis not present

## 2020-09-05 DIAGNOSIS — G4733 Obstructive sleep apnea (adult) (pediatric): Secondary | ICD-10-CM | POA: Diagnosis not present

## 2020-09-05 DIAGNOSIS — D61818 Other pancytopenia: Secondary | ICD-10-CM | POA: Diagnosis not present

## 2020-09-05 DIAGNOSIS — I252 Old myocardial infarction: Secondary | ICD-10-CM | POA: Diagnosis not present

## 2020-09-06 ENCOUNTER — Telehealth: Payer: Self-pay

## 2020-09-06 DIAGNOSIS — C8382 Other non-follicular lymphoma, intrathoracic lymph nodes: Secondary | ICD-10-CM | POA: Diagnosis not present

## 2020-09-06 DIAGNOSIS — D761 Hemophagocytic lymphohistiocytosis: Secondary | ICD-10-CM | POA: Diagnosis not present

## 2020-09-06 DIAGNOSIS — Z79899 Other long term (current) drug therapy: Secondary | ICD-10-CM | POA: Diagnosis not present

## 2020-09-07 ENCOUNTER — Other Ambulatory Visit: Payer: Self-pay

## 2020-09-07 NOTE — Telephone Encounter (Signed)
@   1406- I notified Dr Bobby Rumpf of below.      I spoke with Ginger, pt's wife. She states they come home Monday. "His fevers are under control now. He feels better. His appetite is better since he has been on the steroids. They found out he has fungus in lungs,and are treating him with voriconazole.  He had 6 hr infusion yesterday @ [facility] - obinutuzumab IV for the marginal zone lymphoma. He will be starting Revlimid (& stay on steroid) probably next week for [place]. Physical therapy will be coming out twice weekly to help him. She mentioned that he fell 3 times Monday, once he got home. But prior to that, he was walking pretty good in the hospital. Please tell Dr Bobby Rumpf how grateful we are that he got [place] up to [facility] for more eyes to see him".

## 2020-09-08 DIAGNOSIS — E871 Hypo-osmolality and hyponatremia: Secondary | ICD-10-CM | POA: Diagnosis not present

## 2020-09-08 DIAGNOSIS — J449 Chronic obstructive pulmonary disease, unspecified: Secondary | ICD-10-CM | POA: Diagnosis not present

## 2020-09-08 DIAGNOSIS — D761 Hemophagocytic lymphohistiocytosis: Secondary | ICD-10-CM | POA: Diagnosis not present

## 2020-09-08 DIAGNOSIS — C8382 Other non-follicular lymphoma, intrathoracic lymph nodes: Secondary | ICD-10-CM | POA: Diagnosis not present

## 2020-09-08 DIAGNOSIS — R509 Fever, unspecified: Secondary | ICD-10-CM | POA: Diagnosis not present

## 2020-09-08 DIAGNOSIS — D61818 Other pancytopenia: Secondary | ICD-10-CM | POA: Diagnosis not present

## 2020-09-11 DIAGNOSIS — Z7189 Other specified counseling: Secondary | ICD-10-CM | POA: Diagnosis not present

## 2020-09-11 DIAGNOSIS — C8510 Unspecified B-cell lymphoma, unspecified site: Secondary | ICD-10-CM | POA: Diagnosis not present

## 2020-09-11 DIAGNOSIS — R627 Adult failure to thrive: Secondary | ICD-10-CM | POA: Diagnosis present

## 2020-09-11 DIAGNOSIS — H903 Sensorineural hearing loss, bilateral: Secondary | ICD-10-CM | POA: Diagnosis present

## 2020-09-11 DIAGNOSIS — Z049 Encounter for examination and observation for unspecified reason: Secondary | ICD-10-CM | POA: Diagnosis not present

## 2020-09-11 DIAGNOSIS — M5124 Other intervertebral disc displacement, thoracic region: Secondary | ICD-10-CM | POA: Diagnosis not present

## 2020-09-11 DIAGNOSIS — E669 Obesity, unspecified: Secondary | ICD-10-CM | POA: Diagnosis present

## 2020-09-11 DIAGNOSIS — R0602 Shortness of breath: Secondary | ICD-10-CM | POA: Diagnosis not present

## 2020-09-11 DIAGNOSIS — Z515 Encounter for palliative care: Secondary | ICD-10-CM | POA: Diagnosis not present

## 2020-09-11 DIAGNOSIS — D61810 Antineoplastic chemotherapy induced pancytopenia: Secondary | ICD-10-CM | POA: Diagnosis not present

## 2020-09-11 DIAGNOSIS — C8582 Other specified types of non-Hodgkin lymphoma, intrathoracic lymph nodes: Secondary | ICD-10-CM | POA: Diagnosis not present

## 2020-09-11 DIAGNOSIS — D61818 Other pancytopenia: Secondary | ICD-10-CM | POA: Diagnosis not present

## 2020-09-11 DIAGNOSIS — G4733 Obstructive sleep apnea (adult) (pediatric): Secondary | ICD-10-CM | POA: Diagnosis present

## 2020-09-11 DIAGNOSIS — R5081 Fever presenting with conditions classified elsewhere: Secondary | ICD-10-CM | POA: Diagnosis not present

## 2020-09-11 DIAGNOSIS — G893 Neoplasm related pain (acute) (chronic): Secondary | ICD-10-CM | POA: Diagnosis not present

## 2020-09-11 DIAGNOSIS — M4310 Spondylolisthesis, site unspecified: Secondary | ICD-10-CM | POA: Diagnosis present

## 2020-09-11 DIAGNOSIS — I252 Old myocardial infarction: Secondary | ICD-10-CM | POA: Diagnosis not present

## 2020-09-11 DIAGNOSIS — R5381 Other malaise: Secondary | ICD-10-CM | POA: Diagnosis not present

## 2020-09-11 DIAGNOSIS — R29898 Other symptoms and signs involving the musculoskeletal system: Secondary | ICD-10-CM | POA: Diagnosis not present

## 2020-09-11 DIAGNOSIS — R601 Generalized edema: Secondary | ICD-10-CM | POA: Diagnosis not present

## 2020-09-11 DIAGNOSIS — Z79899 Other long term (current) drug therapy: Secondary | ICD-10-CM | POA: Diagnosis not present

## 2020-09-11 DIAGNOSIS — B441 Other pulmonary aspergillosis: Secondary | ICD-10-CM | POA: Diagnosis not present

## 2020-09-11 DIAGNOSIS — N170 Acute kidney failure with tubular necrosis: Secondary | ICD-10-CM | POA: Diagnosis present

## 2020-09-11 DIAGNOSIS — E875 Hyperkalemia: Secondary | ICD-10-CM | POA: Diagnosis not present

## 2020-09-11 DIAGNOSIS — R338 Other retention of urine: Secondary | ICD-10-CM | POA: Diagnosis not present

## 2020-09-11 DIAGNOSIS — I10 Essential (primary) hypertension: Secondary | ICD-10-CM | POA: Diagnosis present

## 2020-09-11 DIAGNOSIS — R296 Repeated falls: Secondary | ICD-10-CM | POA: Diagnosis present

## 2020-09-11 DIAGNOSIS — I2510 Atherosclerotic heart disease of native coronary artery without angina pectoris: Secondary | ICD-10-CM | POA: Diagnosis present

## 2020-09-11 DIAGNOSIS — Z955 Presence of coronary angioplasty implant and graft: Secondary | ICD-10-CM | POA: Diagnosis not present

## 2020-09-11 DIAGNOSIS — Z992 Dependence on renal dialysis: Secondary | ICD-10-CM | POA: Diagnosis not present

## 2020-09-11 DIAGNOSIS — Z7401 Bed confinement status: Secondary | ICD-10-CM | POA: Diagnosis not present

## 2020-09-11 DIAGNOSIS — E861 Hypovolemia: Secondary | ICD-10-CM | POA: Diagnosis not present

## 2020-09-11 DIAGNOSIS — E8809 Other disorders of plasma-protein metabolism, not elsewhere classified: Secondary | ICD-10-CM | POA: Diagnosis not present

## 2020-09-11 DIAGNOSIS — E785 Hyperlipidemia, unspecified: Secondary | ICD-10-CM | POA: Diagnosis present

## 2020-09-11 DIAGNOSIS — N179 Acute kidney failure, unspecified: Secondary | ICD-10-CM | POA: Diagnosis not present

## 2020-09-11 DIAGNOSIS — Z9221 Personal history of antineoplastic chemotherapy: Secondary | ICD-10-CM | POA: Diagnosis not present

## 2020-09-11 DIAGNOSIS — J449 Chronic obstructive pulmonary disease, unspecified: Secondary | ICD-10-CM | POA: Diagnosis not present

## 2020-09-11 DIAGNOSIS — T451X5A Adverse effect of antineoplastic and immunosuppressive drugs, initial encounter: Secondary | ICD-10-CM | POA: Diagnosis not present

## 2020-09-11 DIAGNOSIS — R34 Anuria and oliguria: Secondary | ICD-10-CM | POA: Diagnosis not present

## 2020-09-11 DIAGNOSIS — N19 Unspecified kidney failure: Secondary | ICD-10-CM | POA: Diagnosis not present

## 2020-09-11 DIAGNOSIS — C8580 Other specified types of non-Hodgkin lymphoma, unspecified site: Secondary | ICD-10-CM | POA: Diagnosis not present

## 2020-09-11 DIAGNOSIS — F419 Anxiety disorder, unspecified: Secondary | ICD-10-CM | POA: Diagnosis present

## 2020-09-11 DIAGNOSIS — F064 Anxiety disorder due to known physiological condition: Secondary | ICD-10-CM | POA: Diagnosis not present

## 2020-09-11 DIAGNOSIS — R531 Weakness: Secondary | ICD-10-CM | POA: Diagnosis not present

## 2020-09-11 DIAGNOSIS — Z8572 Personal history of non-Hodgkin lymphomas: Secondary | ICD-10-CM | POA: Diagnosis not present

## 2020-09-11 DIAGNOSIS — D761 Hemophagocytic lymphohistiocytosis: Secondary | ICD-10-CM | POA: Diagnosis not present

## 2020-09-11 DIAGNOSIS — Z7982 Long term (current) use of aspirin: Secondary | ICD-10-CM | POA: Diagnosis not present

## 2020-09-11 DIAGNOSIS — I517 Cardiomegaly: Secondary | ICD-10-CM | POA: Diagnosis not present

## 2020-09-11 DIAGNOSIS — E872 Acidosis: Secondary | ICD-10-CM | POA: Diagnosis not present

## 2020-09-11 DIAGNOSIS — D709 Neutropenia, unspecified: Secondary | ICD-10-CM | POA: Diagnosis not present

## 2020-09-11 DIAGNOSIS — C8382 Other non-follicular lymphoma, intrathoracic lymph nodes: Secondary | ICD-10-CM | POA: Diagnosis not present

## 2020-09-11 DIAGNOSIS — M60851 Other myositis, right thigh: Secondary | ICD-10-CM | POA: Diagnosis not present

## 2020-09-11 DIAGNOSIS — F17210 Nicotine dependence, cigarettes, uncomplicated: Secondary | ICD-10-CM | POA: Diagnosis not present

## 2020-09-11 DIAGNOSIS — E43 Unspecified severe protein-calorie malnutrition: Secondary | ICD-10-CM | POA: Diagnosis not present

## 2020-09-11 DIAGNOSIS — D696 Thrombocytopenia, unspecified: Secondary | ICD-10-CM | POA: Diagnosis not present

## 2020-09-11 DIAGNOSIS — R188 Other ascites: Secondary | ICD-10-CM | POA: Diagnosis not present

## 2020-09-11 DIAGNOSIS — Z66 Do not resuscitate: Secondary | ICD-10-CM | POA: Diagnosis not present

## 2020-09-11 DIAGNOSIS — M609 Myositis, unspecified: Secondary | ICD-10-CM | POA: Diagnosis not present

## 2020-09-11 DIAGNOSIS — K219 Gastro-esophageal reflux disease without esophagitis: Secondary | ICD-10-CM | POA: Diagnosis present

## 2020-09-11 DIAGNOSIS — D649 Anemia, unspecified: Secondary | ICD-10-CM | POA: Diagnosis not present

## 2020-09-11 DIAGNOSIS — C8590 Non-Hodgkin lymphoma, unspecified, unspecified site: Secondary | ICD-10-CM | POA: Diagnosis not present

## 2020-09-11 DIAGNOSIS — E871 Hypo-osmolality and hyponatremia: Secondary | ICD-10-CM | POA: Diagnosis not present

## 2020-09-13 ENCOUNTER — Other Ambulatory Visit: Payer: Self-pay | Admitting: Cardiology

## 2020-09-13 NOTE — Telephone Encounter (Signed)
Rx refill sent to pharmacy. 

## 2020-09-18 DIAGNOSIS — C8382 Other non-follicular lymphoma, intrathoracic lymph nodes: Secondary | ICD-10-CM | POA: Diagnosis not present

## 2020-09-26 DEATH — deceased

## 2021-03-16 IMAGING — CR DG CHEST 2V
2 series · 2 of 2 positions shown · non-contrast
Comparison: December 01, 2014 chest radiograph and chest CT April 14, 2019

CLINICAL DATA: Preoperative mediastinoscopy. Sleep apnea.
Hypertension.

EXAM:
CHEST - 2 VIEW

[w chest pa]
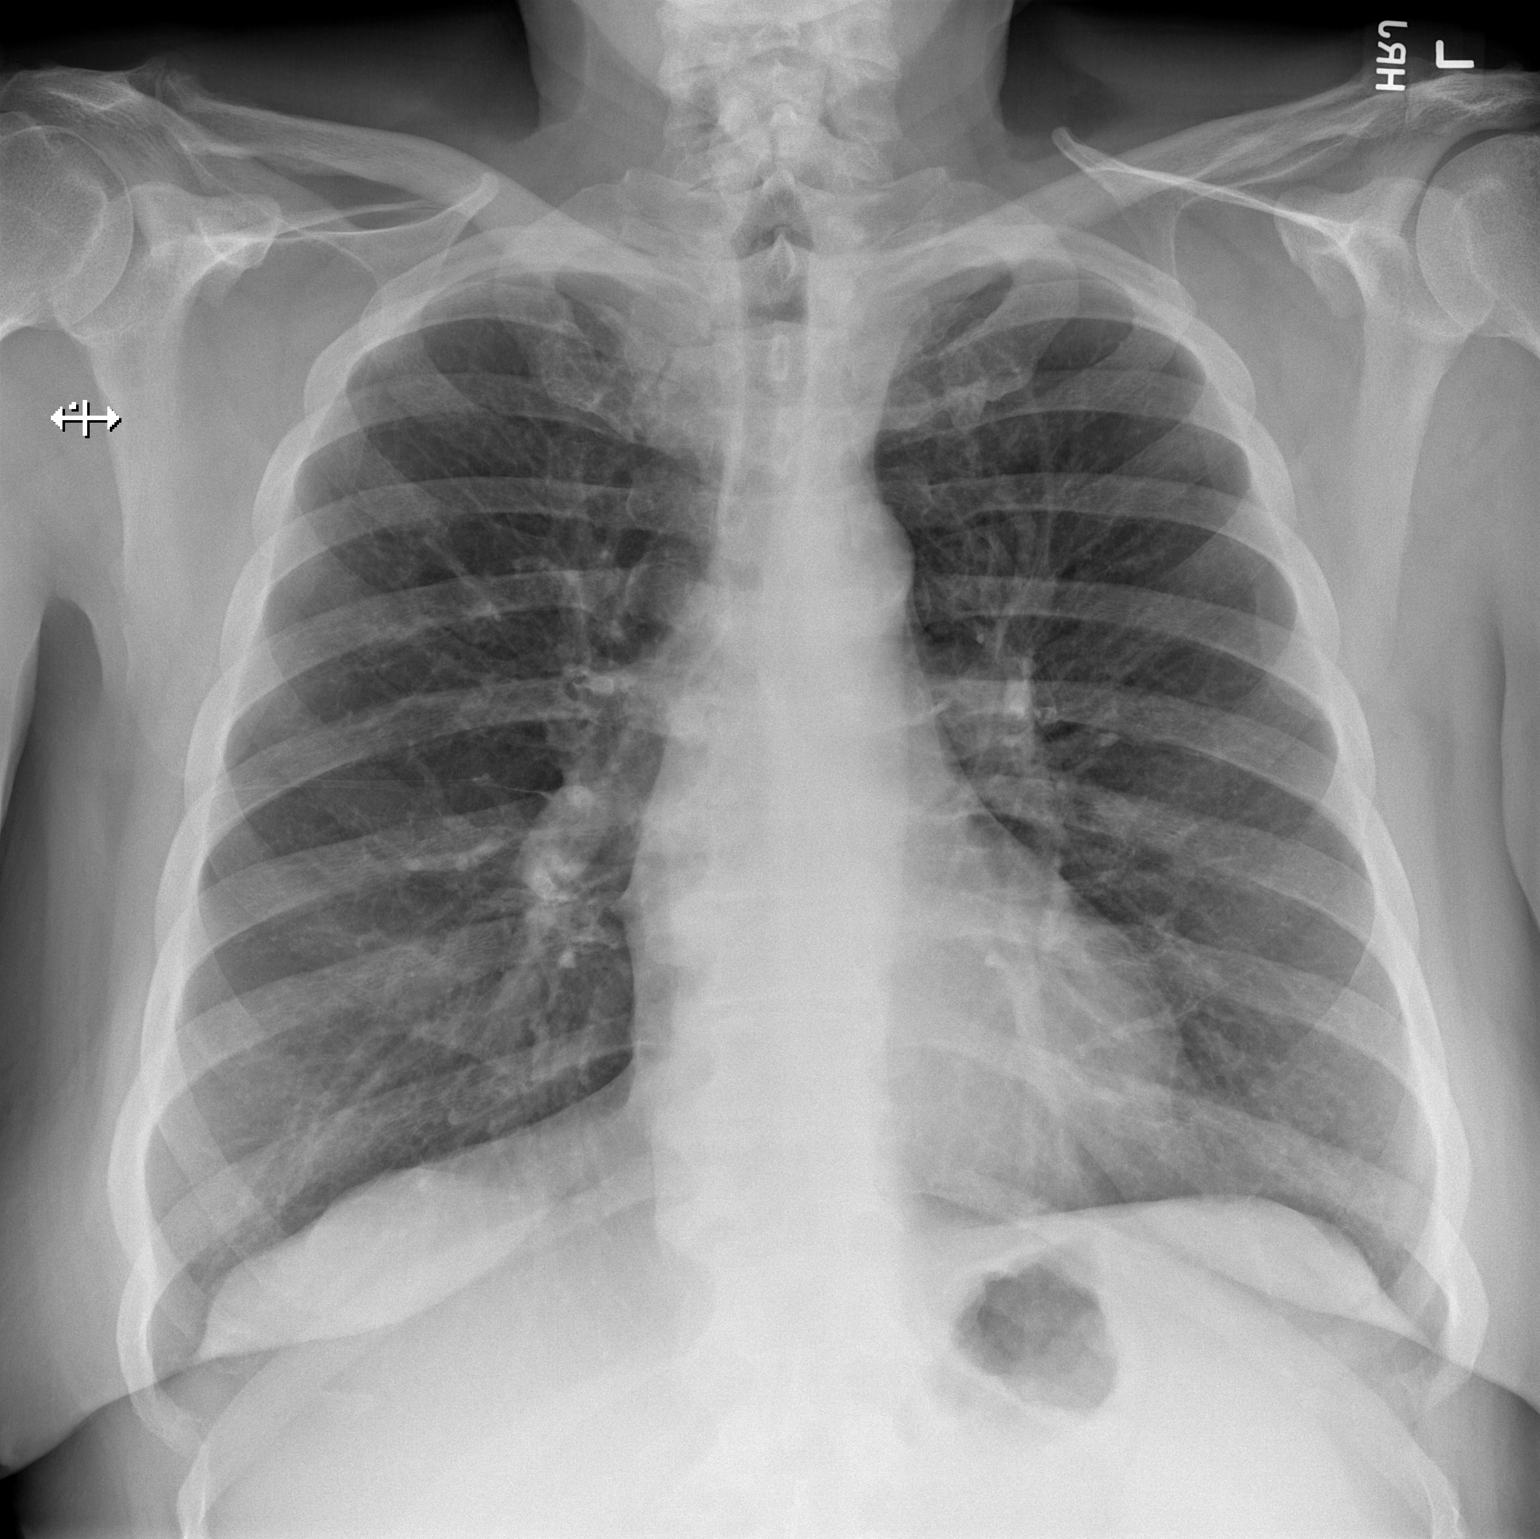

[w chest lat]
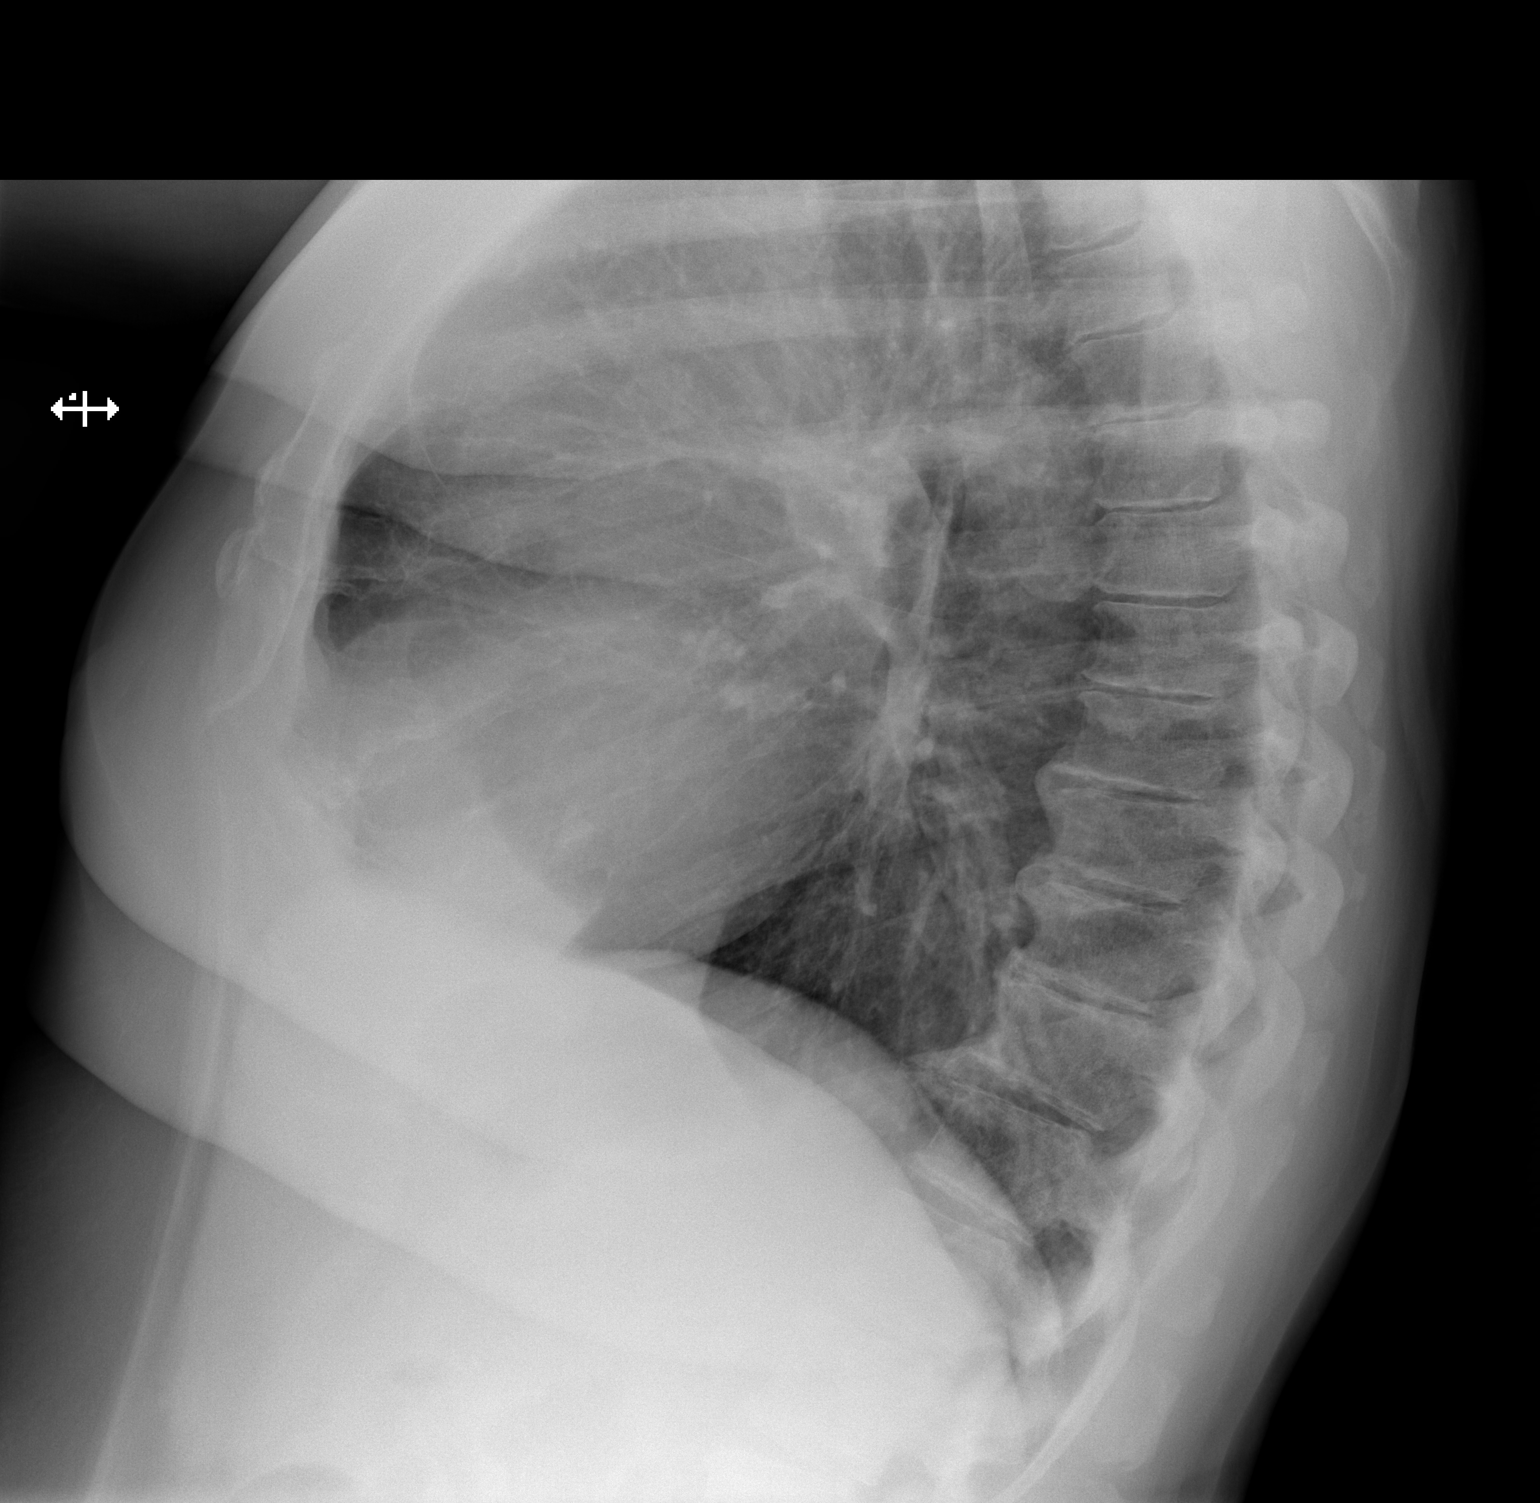

[2 of 2 positions shown; findings below may reference images not displayed]

FINDINGS: Lungs are clear. The heart size and pulmonary vascularity are
normal. There is degenerative change in the thoracic spine. No
adenopathy is demonstrable by radiography.
IMPRESSION: No edema or consolidation. The focal prominent lymph nodes seen on
recent CT are not appreciable by radiography.
